# Patient Record
Sex: Female | Born: 1960 | Race: White | Hispanic: No | Marital: Married | Smoking: Never smoker
Health system: Southern US, Community
[De-identification: ages and names within clinical notes are randomized; demographics above are authoritative.]

## PROBLEM LIST (undated history)

## (undated) DIAGNOSIS — R519 Headache, unspecified: Secondary | ICD-10-CM

## (undated) DIAGNOSIS — Z803 Family history of malignant neoplasm of breast: Secondary | ICD-10-CM

## (undated) DIAGNOSIS — N3090 Cystitis, unspecified without hematuria: Secondary | ICD-10-CM

## (undated) DIAGNOSIS — Z9189 Other specified personal risk factors, not elsewhere classified: Secondary | ICD-10-CM

## (undated) DIAGNOSIS — Z8489 Family history of other specified conditions: Secondary | ICD-10-CM

## (undated) DIAGNOSIS — M1990 Unspecified osteoarthritis, unspecified site: Secondary | ICD-10-CM

## (undated) DIAGNOSIS — K219 Gastro-esophageal reflux disease without esophagitis: Secondary | ICD-10-CM

## (undated) DIAGNOSIS — Z1501 Genetic susceptibility to malignant neoplasm of breast: Secondary | ICD-10-CM

## (undated) DIAGNOSIS — N390 Urinary tract infection, site not specified: Secondary | ICD-10-CM

## (undated) DIAGNOSIS — Z1589 Genetic susceptibility to other disease: Secondary | ICD-10-CM

## (undated) DIAGNOSIS — R7303 Prediabetes: Secondary | ICD-10-CM

## (undated) DIAGNOSIS — R51 Headache: Secondary | ICD-10-CM

## (undated) DIAGNOSIS — Z87442 Personal history of urinary calculi: Secondary | ICD-10-CM

## (undated) DIAGNOSIS — Z1371 Encounter for nonprocreative screening for genetic disease carrier status: Secondary | ICD-10-CM

## (undated) DIAGNOSIS — R87619 Unspecified abnormal cytological findings in specimens from cervix uteri: Secondary | ICD-10-CM

## (undated) HISTORY — DX: Genetic susceptibility to other disease: Z15.89

## (undated) HISTORY — DX: Genetic susceptibility to malignant neoplasm of breast: Z15.01

## (undated) HISTORY — DX: Cystitis, unspecified without hematuria: N30.90

## (undated) HISTORY — DX: Encounter for nonprocreative screening for genetic disease carrier status: Z13.71

## (undated) HISTORY — DX: Other specified personal risk factors, not elsewhere classified: Z91.89

## (undated) HISTORY — PX: APPENDECTOMY: SHX54

## (undated) HISTORY — PX: TONSILLECTOMY: SUR1361

## (undated) HISTORY — DX: Unspecified abnormal cytological findings in specimens from cervix uteri: R87.619

## (undated) HISTORY — DX: Family history of malignant neoplasm of breast: Z80.3

---

## 1992-04-14 HISTORY — PX: BREAST CYST ASPIRATION: SHX578

## 2001-07-06 HISTORY — PX: BREAST BIOPSY: SHX20

## 2004-06-26 ENCOUNTER — Inpatient Hospital Stay: Payer: Self-pay | Admitting: Surgery

## 2004-06-26 ENCOUNTER — Ambulatory Visit: Payer: Self-pay | Admitting: Internal Medicine

## 2005-10-02 ENCOUNTER — Ambulatory Visit: Payer: Self-pay | Admitting: Unknown Physician Specialty

## 2005-11-14 ENCOUNTER — Ambulatory Visit: Payer: Self-pay | Admitting: Internal Medicine

## 2006-07-24 ENCOUNTER — Ambulatory Visit: Payer: Self-pay | Admitting: Family Medicine

## 2006-09-22 ENCOUNTER — Emergency Department: Payer: Self-pay | Admitting: Emergency Medicine

## 2006-09-23 ENCOUNTER — Emergency Department: Payer: Self-pay | Admitting: Emergency Medicine

## 2007-11-25 ENCOUNTER — Ambulatory Visit: Payer: Self-pay | Admitting: Unknown Physician Specialty

## 2008-11-28 ENCOUNTER — Ambulatory Visit: Payer: Self-pay | Admitting: General Surgery

## 2009-09-26 ENCOUNTER — Ambulatory Visit: Payer: Self-pay | Admitting: General Practice

## 2010-02-01 ENCOUNTER — Ambulatory Visit: Payer: Self-pay | Admitting: Unknown Physician Specialty

## 2011-03-12 ENCOUNTER — Ambulatory Visit: Payer: Self-pay | Admitting: Unknown Physician Specialty

## 2011-04-15 DIAGNOSIS — Z9189 Other specified personal risk factors, not elsewhere classified: Secondary | ICD-10-CM

## 2011-04-15 HISTORY — DX: Other specified personal risk factors, not elsewhere classified: Z91.89

## 2011-12-10 ENCOUNTER — Ambulatory Visit: Payer: Self-pay | Admitting: Unknown Physician Specialty

## 2012-02-04 DIAGNOSIS — Z1371 Encounter for nonprocreative screening for genetic disease carrier status: Secondary | ICD-10-CM

## 2012-02-04 HISTORY — DX: Encounter for nonprocreative screening for genetic disease carrier status: Z13.71

## 2014-11-05 DIAGNOSIS — T378X5A Adverse effect of other specified systemic anti-infectives and antiparasitics, initial encounter: Secondary | ICD-10-CM | POA: Insufficient documentation

## 2014-11-05 DIAGNOSIS — L500 Allergic urticaria: Secondary | ICD-10-CM | POA: Diagnosis not present

## 2014-11-05 MED ORDER — SODIUM CHLORIDE 0.9 % IV BOLUS (SEPSIS)
1000.0000 mL | Freq: Once | INTRAVENOUS | Status: AC
  Administered 2014-11-05: 1000 mL via INTRAVENOUS

## 2014-11-05 MED ORDER — METHYLPREDNISOLONE SODIUM SUCC 125 MG IJ SOLR
INTRAMUSCULAR | Status: AC
  Administered 2014-11-05: 125 mg via INTRAVENOUS
  Filled 2014-11-05: qty 2

## 2014-11-05 MED ORDER — FAMOTIDINE IN NACL 20-0.9 MG/50ML-% IV SOLN
INTRAVENOUS | Status: AC
  Administered 2014-11-05: 20 mg via INTRAVENOUS
  Filled 2014-11-05: qty 50

## 2014-11-05 MED ORDER — FAMOTIDINE IN NACL 20-0.9 MG/50ML-% IV SOLN
20.0000 mg | Freq: Once | INTRAVENOUS | Status: AC
  Administered 2014-11-05: 20 mg via INTRAVENOUS

## 2014-11-05 MED ORDER — METHYLPREDNISOLONE SODIUM SUCC 125 MG IJ SOLR
125.0000 mg | Freq: Once | INTRAMUSCULAR | Status: AC
  Administered 2014-11-05: 125 mg via INTRAVENOUS

## 2014-11-05 NOTE — ED Notes (Signed)
Pt ambulatory to triage with no difficulty. Pt reports she started taking macrobid on 10/27/14. Pt reports the first dose caused her feet to itch and she took benadryl and the itching went away. Friday night pt reports she started again and broke out with a rash and itching. Pt was seen by EMS and given bendaryl 50 mg IM.

## 2014-11-06 ENCOUNTER — Emergency Department
Admission: EM | Admit: 2014-11-06 | Discharge: 2014-11-06 | Disposition: A | Discharge status: Home / Self Care | Payer: PRIVATE HEALTH INSURANCE | Attending: Emergency Medicine | Admitting: Emergency Medicine

## 2014-11-06 DIAGNOSIS — T7840XA Allergy, unspecified, initial encounter: Secondary | ICD-10-CM

## 2014-11-06 MED ORDER — PREDNISONE 20 MG PO TABS: 40.0000 mg | ORAL_TABLET | Freq: Every day | ORAL | Status: DC

## 2014-11-06 NOTE — ED Notes (Signed)
Patient here with allergic reaction to macrobid.

## 2014-11-06 NOTE — Discharge Instructions (Signed)
Allergies  Allergies may happen from anything your body is sensitive to. This may be food, medicines, pollens, chemicals, and many other things. Food allergies can be severe and deadly.  HOME CARE  If you do not know what causes a reaction, keep a diary. Write down the foods you ate and the symptoms that followed. Avoid foods that cause reactions.  If you have red raised spots (hives) or a rash:  Take medicine as told by your doctor.  Use medicines for red raised spots and itching as needed.  Apply cold cloths (compresses) to the skin. Take a cool bath. Avoid hot baths or showers.  If you are severely allergic:  It is often necessary to go to the hospital after you have treated your reaction.  Wear your medical alert jewelry.  You and your family must learn how to give a allergy shot or use an allergy kit (anaphylaxis kit).  Always carry your allergy kit or shot with you. Use this medicine as told by your doctor if a severe reaction is occurring. GET HELP RIGHT AWAY IF:  You have trouble breathing or are making high-pitched whistling sounds (wheezing).  You have a tight feeling in your chest or throat.  You have a puffy (swollen) mouth.  You have red raised spots, puffiness (swelling), or itching all over your body.  You have had a severe reaction that was helped by your allergy kit or shot. The reaction can return once the medicine has worn off.  You think you are having a food allergy. Symptoms most often happen within 30 minutes of eating a food.  Your symptoms have not gone away within 2 days or are getting worse.  You have new symptoms.  You want to retest yourself with a food or drink you think causes an allergic reaction. Only do this under the care of a doctor. MAKE SURE YOU:   Understand these instructions.  Will watch your condition.  Will get help right away if you are not doing well or get worse. Document Released: 07/26/2012 Document Reviewed:  07/26/2012 [facility] Patient Information 2015 [place]. This information is not intended to replace advice given to you by your health care provider. Make sure you discuss any questions you have with your health care provider.

## 2014-11-06 NOTE — ED Provider Notes (Signed)
[facility] Emergency Department Provider Note  Time seen: 1:28 AM  I have reviewed the triage vital signs and the nursing notes.   HISTORY  Chief Complaint Allergic Reaction    HPI Michelle Mejia is a 54 y.o. female presents to the emergency department with hives. According to the patient she started taking Macrobid for a urinary tract infection on 10/27/14. She states she developed itching and hives, she took Benadryl and it went away. She states she has intermittently been getting hives, but she has continued to take the antibiotics. Tonight she had diffuse hives over her chest and upper extremities so she came to the emergency department. Denies any trouble breathing, denies any swelling. Describes the rash as moderate.     No past medical history on file.  There are no active problems to display for this patient.   No past surgical history on file.  No current outpatient prescriptions on file.  Allergies Doxycycline; Phenergan; Tramadol; Keflex; Ketek; Levaquin; Other; and Parafon forte dsc  No family history on file.  Social History History  Substance Use Topics  . Smoking status: Not on file  . Smokeless tobacco: Not on file  . Alcohol Use: Not on file    Review of Systems Constitutional: Negative for fever. Cardiovascular: Negative for chest pain. Respiratory: Negative for shortness of breath. Gastrointestinal: Negative for abdominal pain Genitourinary: States her urinary tract symptoms have resolved. Skin: Positive for hives 10-point ROS otherwise negative.  ____________________________________________   PHYSICAL EXAM:  VITAL SIGNS: ED Triage Vitals  Enc Vitals Group     BP 11/05/14 2219 129/77 mmHg     Pulse Rate 11/05/14 2219 96     Resp 11/05/14 2219 18     Temp 11/05/14 2219 98.3 F (36.8 C)     Temp Source 11/05/14 2219 Oral     SpO2 11/05/14 2219 96 %     Weight 11/05/14 2219 170 lb (77.111 kg)     Height 11/05/14  2219 5\' 4"  (1.626 m)     Head Cir --      Peak Flow --      Pain Score 11/05/14 2220 0     Pain Loc --      Pain Edu? --      Excl. in [place]? --     Constitutional: Alert and oriented. Well appearing and in no distress. Eyes: Normal exam ENT   Mouth/Throat: Mucous membranes are moist. No oral swelling noted. Cardiovascular: Normal rate, regular rhythm. No murmur Respiratory: Normal respiratory effort without tachypnea nor retractions. Breath sounds are clear Gastrointestinal: Soft and nontender. No distention.  Musculoskeletal: Nontender with normal range of motion in all extremities. Neurologic:  Normal speech and language. No gross focal neurologic deficits Skin:  Skin is warm, dry and intact. No hives currently. Psychiatric: Mood and affect are normal. Speech and behavior are normal.   ____________________________________________     INITIAL IMPRESSION / ASSESSMENT AND PLAN / ED COURSE  Pertinent labs & imaging results that were available during my care of the patient were reviewed by me and considered in my medical decision making (see chart for details).  Patient with likely allergic reaction to Macrobid. Patient received Solu-Medrol, Benadryl, Pepcid in the emergency department. Upon my examination the patient no longer has hives, the husband showed me pictures, and the hives appeared to be fairly moderate and diffuse. Patient denies any trouble breathing, or oral swelling at any time. We will discharge the patient home on daily  Claritin, Benadryl as needed, and 5 days of prednisone. I discussed this plan. The patient and the husband and they are agreeable. I also discussed discontinuing Macrobid. They're agreeable.  ____________________________________________   FINAL CLINICAL IMPRESSION(S) / ED DIAGNOSES  Allergic reaction   Harvest Dark, MD 11/06/14 [phone]

## 2015-01-13 DIAGNOSIS — M2550 Pain in unspecified joint: Secondary | ICD-10-CM | POA: Insufficient documentation

## 2015-02-19 ENCOUNTER — Encounter: Payer: Self-pay | Admitting: Physician Assistant

## 2015-02-19 ENCOUNTER — Ambulatory Visit: Payer: Self-pay | Admitting: Physician Assistant

## 2015-02-19 VITALS — BP 110/79 | HR 90 | Temp 97.9°F

## 2015-02-19 DIAGNOSIS — N390 Urinary tract infection, site not specified: Secondary | ICD-10-CM

## 2015-02-19 LAB — POCT URINALYSIS DIPSTICK
Bilirubin, UA: NEGATIVE
Glucose, UA: NEGATIVE
Ketones, UA: NEGATIVE
LEUKOCYTES UA: NEGATIVE
Nitrite, UA: NEGATIVE
PH UA: 5.5
PROTEIN UA: NEGATIVE
Spec Grav, UA: 1.02
Urobilinogen, UA: 0.2

## 2015-02-19 MED ORDER — PHENAZOPYRIDINE HCL 200 MG PO TABS: 200.0000 mg | ORAL_TABLET | Freq: Three times a day (TID) | ORAL | Status: DC | PRN

## 2015-02-19 MED ORDER — CIPROFLOXACIN HCL 500 MG PO TABS: 500.0000 mg | ORAL_TABLET | Freq: Two times a day (BID) | ORAL | Status: DC

## 2015-02-19 NOTE — Addendum Note (Signed)
Addended by: Rudene Anda T on: 02/19/2015 09:54 AM   Modules accepted: Orders

## 2015-02-19 NOTE — Progress Notes (Signed)
S/ 3 day hx of dysuria , frequency , and left flank pain, no fever ; was camping and had to go to [facility] bathroom , she held her urine more, denies vaginal d/c or gyn sxs Has been forcing po fluids  O/ VSS NAD Abd SP discomfort , neg CVA  U/A trace blood  A/ UTI  P cipro 500 mg one bid x 3 days. Rx pyridium 200 mg one tid prn #10 o rf.f/u prn not improving

## 2015-03-07 ENCOUNTER — Ambulatory Visit: Payer: Self-pay | Admitting: Physician Assistant

## 2015-03-07 ENCOUNTER — Encounter: Payer: Self-pay | Admitting: Physician Assistant

## 2015-03-07 VITALS — BP 121/70 | HR 101 | Temp 98.6°F

## 2015-03-07 DIAGNOSIS — J069 Acute upper respiratory infection, unspecified: Secondary | ICD-10-CM

## 2015-03-07 MED ORDER — FLUTICASONE PROPIONATE 50 MCG/ACT NA SUSP: 2.0000 | Freq: Every day | NASAL | Status: DC

## 2015-03-07 MED ORDER — BENZONATATE 200 MG PO CAPS: 200.0000 mg | ORAL_CAPSULE | Freq: Three times a day (TID) | ORAL | Status: DC | PRN

## 2015-03-07 NOTE — Progress Notes (Signed)
S: C/o runny nose and congestion for 3 days, no fever, chills, cp/sob, v/d; mucus clear throughout the day, cough is sporadic, voice is hoarse, felt worse last night than today  Using otc meds  O: PE: perrl eomi, normocephalic, tms dull, nasal mucosa red and swollen, throat injected, neck supple no lymph, lungs c t a, cv rrr, neuro intact  A:  Acute viral uri   P: drink fluids, continue regular meds , use otc meds of choice, return if not improving in 5 days, return earlier if worsening , tessalon perls for cough, flonase refill; rx for amoxil and diflucan given in case pt worsens over holiday weekend

## 2015-03-12 ENCOUNTER — Ambulatory Visit: Payer: Self-pay | Admitting: Physician Assistant

## 2015-03-12 ENCOUNTER — Encounter: Payer: Self-pay | Admitting: Physician Assistant

## 2015-03-12 VITALS — BP 130/70 | HR 110 | Temp 98.4°F

## 2015-03-12 DIAGNOSIS — J069 Acute upper respiratory infection, unspecified: Secondary | ICD-10-CM

## 2015-03-12 NOTE — Progress Notes (Signed)
S: recheck of cough and congestion, started on amoxil yesterday, started with low grade fever last 2 days and mucus turned green yesterday, cough was making her feel sob yesterday but is better today, no cp/sob today  O: vitals wnl, nad, tms dull, nasal mucosa grossly red and swollen, throat wnl, neck supple no lymph, lungs c t a, cv rrr, cough is dry  A: acute uri  P: finish amoxil, no work until has not had fever in 24 hours, return if worsening

## 2015-04-02 ENCOUNTER — Encounter: Payer: Self-pay | Admitting: *Deleted

## 2015-04-02 ENCOUNTER — Encounter
Admission: RE | Disposition: A | Discharge status: Home Health | Payer: Self-pay | Source: Ambulatory Visit | Attending: Unknown Physician Specialty

## 2015-04-02 ENCOUNTER — Ambulatory Visit: Payer: PRIVATE HEALTH INSURANCE | Admitting: Anesthesiology

## 2015-04-02 ENCOUNTER — Ambulatory Visit
Admission: RE | Admit: 2015-04-02 | Discharge: 2015-04-02 | Disposition: A | Discharge status: Home Health | Payer: PRIVATE HEALTH INSURANCE | Source: Ambulatory Visit | Attending: Unknown Physician Specialty | Admitting: Unknown Physician Specialty

## 2015-04-02 DIAGNOSIS — Z888 Allergy status to other drugs, medicaments and biological substances status: Secondary | ICD-10-CM | POA: Diagnosis not present

## 2015-04-02 DIAGNOSIS — D120 Benign neoplasm of cecum: Secondary | ICD-10-CM | POA: Diagnosis not present

## 2015-04-02 DIAGNOSIS — K621 Rectal polyp: Secondary | ICD-10-CM | POA: Diagnosis not present

## 2015-04-02 DIAGNOSIS — Z881 Allergy status to other antibiotic agents status: Secondary | ICD-10-CM | POA: Diagnosis not present

## 2015-04-02 DIAGNOSIS — Z1211 Encounter for screening for malignant neoplasm of colon: Secondary | ICD-10-CM | POA: Diagnosis present

## 2015-04-02 DIAGNOSIS — Z8371 Family history of colonic polyps: Secondary | ICD-10-CM | POA: Insufficient documentation

## 2015-04-02 DIAGNOSIS — K640 First degree hemorrhoids: Secondary | ICD-10-CM | POA: Diagnosis not present

## 2015-04-02 DIAGNOSIS — D124 Benign neoplasm of descending colon: Secondary | ICD-10-CM | POA: Insufficient documentation

## 2015-04-02 DIAGNOSIS — Z79899 Other long term (current) drug therapy: Secondary | ICD-10-CM | POA: Diagnosis not present

## 2015-04-02 HISTORY — PX: COLONOSCOPY: SHX5424

## 2015-04-02 SURGERY — COLONOSCOPY
Anesthesia: General

## 2015-04-02 MED ORDER — SODIUM CHLORIDE 0.9 % IV SOLN
INTRAVENOUS | Status: DC
  Administered 2015-04-02: 17:00:00 via INTRAVENOUS
  Administered 2015-04-02: 1000 mL via INTRAVENOUS

## 2015-04-02 MED ORDER — PROPOFOL 500 MG/50ML IV EMUL
INTRAVENOUS | Status: DC | PRN
  Administered 2015-04-02: 90 ug/kg/min via INTRAVENOUS

## 2015-04-02 MED ORDER — FENTANYL CITRATE (PF) 100 MCG/2ML IJ SOLN
INTRAMUSCULAR | Status: DC | PRN
  Administered 2015-04-02: 50 ug via INTRAVENOUS

## 2015-04-02 MED ORDER — SODIUM CHLORIDE 0.9 % IV SOLN: INTRAVENOUS | Status: DC

## 2015-04-02 MED ORDER — PROPOFOL 10 MG/ML IV BOLUS
INTRAVENOUS | Status: DC | PRN
  Administered 2015-04-02: 40 mg via INTRAVENOUS

## 2015-04-02 NOTE — Op Note (Signed)
[facility] Gastroenterology Patient Name: Michelle Mejia Procedure Date: 04/02/2015 1:52 PM MRN: [id number] Account #: [id number] Date of Birth: 1960/11/03 Admit Type: Outpatient Age: 54 Room: [facility] ENDO ROOM 1 Gender: Female Note Status: Finalized Procedure:         Colonoscopy Indications:       Screening for colorectal malignant neoplasm Providers:         Manya Silvas, MD Referring MD:      Leonie Douglas. Doy Hutching, MD (Referring MD), Versie Starks, MD                     (Referring MD) Medicines:         Propofol per Anesthesia Complications:     No immediate complications. Procedure:         Pre-Anesthesia Assessment:                    - After reviewing the risks and benefits, the patient was                     deemed in satisfactory condition to undergo the procedure.                    After obtaining informed consent, the colonoscope was                     passed under direct vision. Throughout the procedure, the                     patient's blood pressure, pulse, and oxygen saturations                     were monitored continuously. The Colonoscope was                     introduced through the anus and advanced to the the cecum,                     identified by appendiceal orifice and ileocecal valve. The                     Colonoscope was introduced through the anus and advanced                     to the the cecum, identified by appendiceal orifice and                     ileocecal valve. The colonoscopy was performed without                     difficulty. The patient tolerated the procedure well. The                     quality of the bowel preparation was excellent. Findings:      A 10 mm polyp was found in the proximal descending colon. The polyp was       sessile. The polyp was removed with a hot snare. Resection and retrieval       were complete.      Three sessile polyps were found in the rectum. The polyps were       diminutive in  size. These polyps were removed with a jumbo cold forceps.       Resection and retrieval were complete.  Internal hemorrhoids were found during endoscopy. The hemorrhoids were       small and Grade I (internal hemorrhoids that do not prolapse).      A diminutive polyp was found in the cecum. The polyp was sessile. The       polyp was removed with a jumbo cold forceps. Resection and retrieval       were complete.      The exam was otherwise without abnormality. Impression:        - One 10 mm polyp in the proximal descending colon.                     Resected and retrieved.                    - Three diminutive polyps in the rectum. Resected and                     retrieved.                    - Internal hemorrhoids.                    - The examination was otherwise normal. Recommendation:    - Await pathology results.                    Manya Silvas, MD 04/02/2015 5:16:57 PM This report has been signed electronically. Number of Addenda: 0 Note Initiated On: 04/02/2015 1:52 PM Scope Withdrawal Time: 0 hours 12 minutes 11 seconds  Total Procedure Duration: 0 hours 17 minutes 39 seconds       [facility]

## 2015-04-02 NOTE — Anesthesia Preprocedure Evaluation (Signed)
Anesthesia Evaluation  Patient identified by MRN, date of birth, ID band Patient awake    Reviewed: Allergy & Precautions, H&P , NPO status , Patient's Chart, lab work & pertinent test results  History of Anesthesia Complications Negative for: history of anesthetic complications  Airway Mallampati: III  TM Distance: >3 FB Neck ROM: full    Dental no notable dental hx. (+) Teeth Intact   Pulmonary neg pulmonary ROS, neg shortness of breath,    Pulmonary exam normal breath sounds clear to auscultation       Cardiovascular (-) angina(-) Past MI and (-) DOE negative cardio ROS Normal cardiovascular exam Rhythm:regular Rate:Normal     Neuro/Psych negative neurological ROS  negative psych ROS   GI/Hepatic negative GI ROS, Neg liver ROS, neg GERD  ,  Endo/Other  negative endocrine ROS  Renal/GU negative Renal ROS  negative genitourinary   Musculoskeletal   Abdominal   Peds  Hematology negative hematology ROS (+)   Anesthesia Other Findings History reviewed. No pertinent past medical history.  Past Surgical History:   APPENDECTOMY                                                 BMI    Body Mass Index   29.16 kg/m 2      Reproductive/Obstetrics negative OB ROS                             Anesthesia Physical Anesthesia Plan  ASA: I  Anesthesia Plan: General   Post-op Pain Management:    Induction:   Airway Management Planned:   Additional Equipment:   Intra-op Plan:   Post-operative Plan:   Informed Consent: I have reviewed the patients History and Physical, chart, labs and discussed the procedure including the risks, benefits and alternatives for the proposed anesthesia with the patient or authorized representative who has indicated his/her understanding and acceptance.   Dental Advisory Given  Plan Discussed with: Anesthesiologist, CRNA and Surgeon  Anesthesia Plan  Comments:         Anesthesia Quick Evaluation

## 2015-04-02 NOTE — Anesthesia Postprocedure Evaluation (Signed)
Anesthesia Post Note  Patient: Michelle Mejia  Procedure(s) Performed: Procedure(s) (LRB): COLONOSCOPY (N/A)  Patient location during evaluation: PACU Anesthesia Type: General Level of consciousness: awake and alert Pain management: satisfactory to patient Vital Signs Assessment: post-procedure vital signs reviewed and stable Respiratory status: nonlabored ventilation Cardiovascular status: stable Anesthetic complications: no    Last Vitals:  Filed Vitals:   04/02/15 1551 04/02/15 1719  BP: 126/77 115/76  Pulse: 89 80  Temp: 36.4 C 36 C  Resp: 18 14    Last Pain: There were no vitals filed for this visit.               VAN STAVEREN,Michail Boyte

## 2015-04-02 NOTE — H&P (Signed)
Primary Care Physician:  Ashok Cordia, PA-C Primary Gastroenterologist:  Dr. Vira Agar  Pre-Procedure History & Physical: HPI:  Michelle Mejia is a 54 y.o. female is here for an colonoscopy.   History reviewed. No pertinent past medical history.  Past Surgical History  Procedure Laterality Date  . Appendectomy      Prior to Admission medications   Medication Sig Start Date End Date Taking? Authorizing Provider  amoxicillin (AMOXIL) 875 MG tablet Take 875 mg by mouth 2 (two) times daily. Reported on 04/02/2015 03/11/15   Historical Provider, MD  benzonatate (TESSALON) 200 MG capsule Take 1 capsule (200 mg total) by mouth 3 (three) times daily as needed for cough. Patient not taking: Reported on 04/02/2015 03/07/15   Versie Starks, PA-C  ciprofloxacin (CIPRO) 500 MG tablet Take 1 tablet (500 mg total) by mouth 2 (two) times daily. Patient not taking: Reported on 03/07/2015 02/19/15   Tommie Homero Fellers, FNP  DHA-EPA-VITAMIN E PO Take by mouth. Reported on 04/02/2015    Historical Provider, MD  fluticasone (FLONASE) 50 MCG/ACT nasal spray Place 2 sprays into both nostrils daily. 03/07/15   Versie Starks, PA-C  phenazopyridine (PYRIDIUM) 200 MG tablet Take 1 tablet (200 mg total) by mouth 3 (three) times daily as needed for pain. Patient not taking: Reported on 03/07/2015 02/19/15   Blima Singer, FNP  predniSONE (DELTASONE) 20 MG tablet Take 2 tablets (40 mg total) by mouth daily. Patient not taking: Reported on 03/07/2015 11/06/14   Harvest Dark, MD    Allergies as of 02/02/2015 - Review Complete 11/05/2014  Allergen Reaction Noted  . Doxycycline Nausea And Vomiting and Other (See Comments) 11/05/2014  . Phenergan [promethazine hcl] Nausea And Vomiting 11/05/2014  . Tramadol Nausea And Vomiting and Other (See Comments) 11/05/2014  . Keflex [cephalexin] Rash 11/05/2014  . Ketek [telithromycin] Itching and Rash 11/05/2014  . Levaquin [levofloxacin in d5w] Rash 11/05/2014  .  Other Rash 11/05/2014  . Parafon forte dsc [chlorzoxazone] Rash 11/05/2014    History reviewed. No pertinent family history.  Social History   Social History  . Marital Status: Married    Spouse Name: N/A  . Number of Children: N/A  . Years of Education: N/A   Occupational History  . Not on file.   Social History Main Topics  . Smoking status: Never Smoker   . Smokeless tobacco: Not on file  . Alcohol Use: Not on file  . Drug Use: Not on file  . Sexual Activity: Not on file   Other Topics Concern  . Not on file   Social History Narrative    Review of Systems: See HPI, otherwise negative ROS  Physical Exam: BP 126/77 mmHg  Pulse 89  Temp(Src) 97.5 F (36.4 C) (Tympanic)  Resp 18  Ht 5\' 4"  (1.626 m)  Wt 77.111 kg (170 lb)  BMI 29.17 kg/m2  SpO2 100%  LMP  General:   Alert,  pleasant and cooperative in NAD Head:  Normocephalic and atraumatic. Neck:  Supple; no masses or thyromegaly. Lungs:  Clear throughout to auscultation.    Heart:  Regular rate and rhythm. Abdomen:  Soft, nontender and nondistended. Normal bowel sounds, without guarding, and without rebound.   Neurologic:  Alert and  oriented x4;  grossly normal neurologically.  Impression/Plan: MARLII FROST is here for an colonoscopy to be performed for screening  Risks, benefits, limitations, and alternatives regarding  colonoscopy have been reviewed with the patient.  Questions have been answered.  All parties agreeable.   Gaylyn Cheers, MD  04/02/2015, 4:47 PM

## 2015-04-02 NOTE — Transfer of Care (Signed)
Immediate Anesthesia Transfer of Care Note  Patient: Michelle Mejia  Procedure(s) Performed: Procedure(s): COLONOSCOPY (N/A)  Patient Location: PACU  Anesthesia Type:General  Level of Consciousness: awake, alert  and oriented  Airway & Oxygen Therapy: Patient Spontanous Breathing and Patient connected to nasal cannula oxygen  Post-op Assessment: Report given to RN and Post -op Vital signs reviewed and stable  Post vital signs: Reviewed and stable  Last Vitals:  Filed Vitals:   04/02/15 1551 04/02/15 1719  BP: 126/77 115/76  Pulse: 89 80  Temp: 36.4 C 36 C  Resp: 18 14    Complications: No apparent anesthesia complications

## 2015-04-04 LAB — SURGICAL PATHOLOGY

## 2015-04-05 ENCOUNTER — Encounter: Payer: Self-pay | Admitting: Unknown Physician Specialty

## 2015-08-16 ENCOUNTER — Encounter: Payer: Self-pay | Admitting: Physician Assistant

## 2015-08-16 ENCOUNTER — Ambulatory Visit: Payer: Self-pay | Admitting: Physician Assistant

## 2015-08-16 VITALS — BP 120/70 | HR 82 | Temp 97.9°F

## 2015-08-16 DIAGNOSIS — N390 Urinary tract infection, site not specified: Secondary | ICD-10-CM

## 2015-08-16 LAB — POCT URINALYSIS DIPSTICK
Bilirubin, UA: NEGATIVE
Blood, UA: NEGATIVE
Glucose, UA: NEGATIVE
Ketones, UA: NEGATIVE
LEUKOCYTES UA: NEGATIVE
NITRITE UA: POSITIVE
PH UA: 5
PROTEIN UA: NEGATIVE
Spec Grav, UA: >=1.03
UROBILINOGEN UA: 0.2

## 2015-08-16 MED ORDER — CIPROFLOXACIN HCL 250 MG PO TABS: 250.0000 mg | ORAL_TABLET | Freq: Two times a day (BID) | ORAL | Status: DC

## 2015-08-16 NOTE — Progress Notes (Signed)
S:  C/o uti sx for 2 days, burning, urgency, frequency, denies vaginal discharge, abdominal pain or flank pain:  Remainder ros neg  O:  Vitals wnl, nad, no cva tenderness, back nontender, lungs c t a,cv rrr, abd soft nontender, bs normal, n/v intact  A: uti  P: cipro 250mg  bid x 7d, increase water intake, add cranberry juice, return if not improving in 2 -3 days, return earlier if worsening, discussed pyelonephritis sx

## 2016-04-15 ENCOUNTER — Encounter: Payer: Self-pay | Admitting: Physician Assistant

## 2016-04-15 ENCOUNTER — Ambulatory Visit: Payer: Self-pay | Admitting: Physician Assistant

## 2016-04-15 VITALS — BP 130/70 | HR 107 | Temp 98.3°F

## 2016-04-15 DIAGNOSIS — N3000 Acute cystitis without hematuria: Secondary | ICD-10-CM

## 2016-04-15 LAB — POCT URINALYSIS DIPSTICK
Bilirubin, UA: NEGATIVE
Glucose, UA: NEGATIVE
Ketones, UA: NEGATIVE
LEUKOCYTES UA: NEGATIVE
NITRITE UA: NEGATIVE
PH UA: 5.5
PROTEIN UA: NEGATIVE
Spec Grav, UA: 1.025
UROBILINOGEN UA: 0.2

## 2016-04-15 MED ORDER — FLUTICASONE PROPIONATE 50 MCG/ACT NA SUSP: 2.0000 | Freq: Every day | NASAL | 6 refills | Status: DC

## 2016-04-15 NOTE — Progress Notes (Signed)
S: C/o runny nose and congestion for 3 -4 weeks, no fever, chills, cp/sob, v/d; mucus is green and thick, cough is sporadic, c/o of facial and dental pain. Also recent uti and was given septra, ?if its gone  Using otc meds:   O: PE: vitals wnl nad, perrl eomi, normocephalic, tms dull, nasal mucosa pink and swollen, throat injected, neck supple no lymph, lungs c t a, cv rrr, neuro intact  A:  Acute rhinitis, pnd, resolved uti   P: drink fluids, continue regular meds , use otc meds of choice, return if not improving in 5 days, return earlier if worsening , flonase, urine culture due to freq utis;

## 2016-04-16 LAB — URINE CULTURE

## 2016-07-18 ENCOUNTER — Encounter: Payer: Self-pay | Admitting: Physician Assistant

## 2016-07-18 ENCOUNTER — Ambulatory Visit: Payer: Self-pay | Admitting: Physician Assistant

## 2016-07-18 VITALS — BP 109/79 | HR 89 | Temp 98.8°F

## 2016-07-18 DIAGNOSIS — N390 Urinary tract infection, site not specified: Secondary | ICD-10-CM

## 2016-07-18 LAB — POCT URINALYSIS DIPSTICK
Blood, UA: NEGATIVE
Leukocytes, UA: NEGATIVE
Nitrite, UA: POSITIVE
SPEC GRAV UA: 1.025 (ref 1.030–1.035)
UROBILINOGEN UA: 1 (ref ?–2.0)
pH, UA: 5.5 (ref 5.0–8.0)

## 2016-07-18 MED ORDER — PHENAZOPYRIDINE HCL 200 MG PO TABS: 200.0000 mg | ORAL_TABLET | Freq: Three times a day (TID) | ORAL | 0 refills | Status: DC | PRN

## 2016-07-18 MED ORDER — SULFAMETHOXAZOLE-TRIMETHOPRIM 800-160 MG PO TABS: 1.0000 | ORAL_TABLET | Freq: Two times a day (BID) | ORAL | 0 refills | Status: DC

## 2016-07-18 NOTE — Progress Notes (Signed)
   Subjective:UTI    Patient ID: Michelle Mejia, female    DOB: 1960-09-01, 56 y.o.   MRN: [id number]  HPI Patient c/o 2 days of urinary frequency, urgency, and dysuria. Denies vaginal discharge, flank pain, or fever. Third episode this year.   Review of Systems    Negative except for compliant. Objective:   Physical Exam: deferred Dipstick UA revealed +Leuc and Nitrate.        Assessment & Plan:UTI  Urine culture pending. Start Bactrim DS and Pyridium as directed.

## 2016-07-21 LAB — URINE CULTURE

## 2016-08-11 ENCOUNTER — Ambulatory Visit (INDEPENDENT_AMBULATORY_CARE_PROVIDER_SITE_OTHER): Payer: Managed Care, Other (non HMO) | Admitting: Obstetrics & Gynecology

## 2016-08-11 ENCOUNTER — Encounter: Payer: Self-pay | Admitting: Obstetrics & Gynecology

## 2016-08-11 VITALS — BP 110/70 | Ht 64.0 in | Wt 183.0 lb

## 2016-08-11 DIAGNOSIS — Z1211 Encounter for screening for malignant neoplasm of colon: Secondary | ICD-10-CM | POA: Diagnosis not present

## 2016-08-11 DIAGNOSIS — R232 Flushing: Secondary | ICD-10-CM

## 2016-08-11 DIAGNOSIS — Z124 Encounter for screening for malignant neoplasm of cervix: Secondary | ICD-10-CM

## 2016-08-11 DIAGNOSIS — N393 Stress incontinence (female) (male): Secondary | ICD-10-CM

## 2016-08-11 DIAGNOSIS — Z0000 Encounter for general adult medical examination without abnormal findings: Secondary | ICD-10-CM | POA: Diagnosis not present

## 2016-08-11 DIAGNOSIS — Z1231 Encounter for screening mammogram for malignant neoplasm of breast: Secondary | ICD-10-CM | POA: Diagnosis not present

## 2016-08-11 DIAGNOSIS — Z1239 Encounter for other screening for malignant neoplasm of breast: Secondary | ICD-10-CM

## 2016-08-11 NOTE — Patient Instructions (Signed)
Black Cohosh, Cimicifuga racemosa oral dosage forms What is this medicine? BLACK COHOSH (blak KOH hosh) or Cimicifuga racemosa is a dietary supplement. It is promoted to relieve symptoms of menopause, such as hot flashes. The FDA has not approved this supplement for any medical use. This supplement may be used for other purposes; ask your health care provider or pharmacist if you have questions. This medicine may be used for other purposes; ask your health care provider or pharmacist if you have questions. What should I tell my health care provider before I take this medicine? They need to know if you have any of these conditions: -breast cancer -cervical, ovarian or uterine cancer -high blood pressure -infertility -liver disease -menstrual changes or irregular periods -unusual vaginal or uterine bleeding -an unusual or allergic reaction to black cohosh, soybeans, tartrazine dye (yellow dye number 5), other medicines, foods, dyes, or preservatives -pregnant or trying to get pregnant -breast-feeding How should I use this medicine? Take this herb by mouth with a glass of water. Follow the directions on the package labeling, or talk to your health care professional. Do not use for longer than 6 months without the advice of a health care professional. Do not use if you are pregnant or breast-feeding. Talk to your obstetrician-gynecologist or certified nurse-midwife. This herb is not for use in children under the age of 18 years. Overdosage: If you think you have taken too much of this medicine contact a poison control center or emergency room at once. NOTE: This medicine is only for you. Do not share this medicine with others. What if I miss a dose? If you miss a dose, take it as soon as you can. If it is almost time for your next dose, take only that dose. Do not take double or extra doses. What may interact with this medicine? -atorvastatin -cisplatin -fertility treatments This list may not  describe all possible interactions. Give your health care provider a list of all the medicines, herbs, non-prescription drugs, or dietary supplements you use. Also tell them if you smoke, drink alcohol, or use illegal drugs. Some items may interact with your medicine. What should I watch for while using this medicine? Since this herb is derived from a plant, allergic reactions are possible. Stop using this herb if you develop a rash. You may need to see your health care professional, or inform them that this occurred. Report any unusual side effects promptly. If you are taking this herb for menstrual or menopausal symptoms, visit your doctor or health care professional for regular checks on your progress. You should have a complete check-up every 6 months. You will need a regular breast and pelvic exam while on this therapy. Follow the advice of your doctor or health care professional. Women should inform their doctor if they wish to become pregnant or think they might be pregnant. If you have any reason to think you are pregnant, stop taking this herb at once and contact your doctor or health care professional. Herbal or dietary supplements are not regulated like medicines. Rigid quality control standards are not required for dietary supplements. The purity and strength of these products can vary. The safety and effect of this dietary supplement for a certain disease or illness is not well known. This product is not intended to diagnose, treat, cure or prevent any disease. The Food and Drug Administration suggests the following to help consumers protect themselves: -Always read product labels and follow directions. -Natural does not mean a product is   safe for humans to take. -Look for products that include USP after the ingredient name. This means that the manufacturer followed the standards of the U.S. Pharmacopoeia. -Supplements made or sold by a nationally known food or drug company are more likely to  be made under tight controls. You can write to the company for more information about how the product was made. What side effects may I notice from receiving this medicine? Side effects that you should report to your doctor or health care professional as soon as possible: -allergic reactions like skin rash, itching or hives, swelling of the face, lips, or tongue -breathing problems -dizziness -palpitations -signs and symptoms of liver injury like dark yellow or brown urine; general ill feeling or flu-like symptoms; light-colored stools; loss of appetite; nausea; right upper belly pain; unusually weak or tired; yellowing of the eyes or skin -unusual vaginal bleeding Side effects that usually do not require medical attention (report to your doctor or health care professional if they continue or are bothersome): -breast tenderness -headache -nausea -upset stomach This list may not describe all possible side effects. Call your doctor for medical advice about side effects. You may report side effects to FDA at 1-800-FDA-1088. Where should I keep my medicine? Keep out of the reach of children. Store at room temperature between 15 and 30 degrees C (59 and 86 degrees C). Throw away any unused herb after the expiration date. NOTE: This sheet is a summary. It may not cover all possible information. If you have questions about this medicine, talk to your doctor, pharmacist, or health care provider.  2018 Elsevier/Gold Standard (2015-10-10 14:35:09)  

## 2016-08-11 NOTE — Progress Notes (Signed)
HPI:      [name] is a 56 y.o. G1P1001 who LMP was in the past, she presents today for her annual examination.  The patient has no complaints today. The patient is sexually active. Herlast pap: was normal and (2017, no HPV done) and last mammogram: was normal.  The patient does perform self breast exams.  There is notable family history of breast or ovarian cancer in her family. The patient is not taking hormone replacement therapy. Patient denies post-menopausal vaginal bleeding.  LMP 11/2015, sporadic last 2 years.The patient has regular exercise: yes. The patient denies current symptoms of depression.   Occas leakage of urine w stress, wears liner. Hot flashes a bother  GYN Hx: Last Colonoscopy:3 years ago. Normal.  Last DEXA: never ago.    PMHx: No past medical history on file. Past Surgical History:  Procedure Laterality Date  . APPENDECTOMY    . COLONOSCOPY N/A 04/02/2015   Procedure: COLONOSCOPY;  Surgeon: Manya Silvas, MD;  Location: [facility] ENDOSCOPY;  Service: Endoscopy;  Laterality: N/A;   Family History  Problem Relation Age of Onset  . Breast cancer Mother   . Bone cancer Mother   . Ovarian cancer Paternal Aunt    Social History  Substance Use Topics  . Smoking status: Never Smoker  . Smokeless tobacco: Never Used  . Alcohol use No   No current outpatient prescriptions on file. Allergies: Doxycycline; Phenergan [promethazine hcl]; Tramadol; Keflex [cephalexin]; Ketek [telithromycin]; Levaquin [levofloxacin in d5w]; Nitrofurantoin monohyd macro; Other; and Parafon forte dsc [chlorzoxazone]  Review of Systems  Constitutional: Negative for chills, fever and malaise/fatigue.  HENT: Negative for congestion, sinus pain and sore throat.   Eyes: Negative for blurred vision and pain.  Respiratory: Negative for cough and wheezing.   Cardiovascular: Negative for chest pain and leg swelling.  Gastrointestinal: Negative for abdominal pain, constipation, diarrhea,  heartburn, nausea and vomiting.  Genitourinary: Negative for dysuria, frequency, hematuria and urgency.  Musculoskeletal: Negative for back pain, joint pain, myalgias and neck pain.  Skin: Negative for itching and rash.  Neurological: Negative for dizziness, tremors and weakness.  Endo/Heme/Allergies: Does not bruise/bleed easily.  Psychiatric/Behavioral: Negative for depression. The patient is not nervous/anxious and does not have insomnia.     Objective: BP 110/70   Ht 5\' 4"  (1.626 m)   Wt 183 lb (83 kg)   LMP 11/14/2015   BMI 31.41 kg/m   Filed Weights   08/11/16 0831  Weight: 183 lb (83 kg)   Body mass index is 31.41 kg/m. Physical Exam  Constitutional: She is oriented to person, place, and time. She appears well-developed and well-nourished. No distress.  Genitourinary: Rectum normal, vagina normal and uterus normal. Pelvic exam was performed with patient supine. There is no rash or lesion on the right labia. There is no rash or lesion on the left labia. Vagina exhibits no lesion. No bleeding in the vagina. Right adnexum does not display mass and does not display tenderness. Left adnexum does not display mass and does not display tenderness. Cervix does not exhibit motion tenderness, lesion, friability or polyp.   Uterus is mobile and midaxial. Uterus is not enlarged or exhibiting a mass.  HENT:  Head: Normocephalic and atraumatic. Head is without laceration.  Right Ear: Hearing normal.  Left Ear: Hearing normal.  Nose: No epistaxis.  No foreign bodies.  Mouth/Throat: Uvula is midline, oropharynx is clear and moist and mucous membranes are normal.  Eyes: Pupils are equal, round, and reactive  to light.  Neck: Normal range of motion. Neck supple. No thyromegaly present.  Cardiovascular: Normal rate and regular rhythm.  Exam reveals no gallop and no friction rub.   No murmur heard. Pulmonary/Chest: Effort normal and breath sounds normal. No respiratory distress. She has no  wheezes. Right breast exhibits no mass, no skin change and no tenderness. Left breast exhibits no mass, no skin change and no tenderness.  Abdominal: Soft. Bowel sounds are normal. She exhibits no distension. There is no tenderness. There is no rebound.  Musculoskeletal: Normal range of motion.  Neurological: She is alert and oriented to person, place, and time. No cranial nerve deficit.  Skin: Skin is warm and dry.  Psychiatric: She has a normal mood and affect. Judgment normal.  Vitals reviewed.   Assessment: Annual Exam 1. Annual physical exam   2. Screening for breast cancer   3. Screen for colon cancer   4. Screening for cervical cancer   5. Hot flashes   6. Stress incontinence     Plan:            1.  Cervical Screening-  Pap smear done today, Pap smear schedule reviewed with patient  2. Breast screening- Exam annually and mammogram scheduled  3. Colonoscopy every 10 years, Hemoccult testing after age 28  4. Labs managed by PCP  5. Counseling for hormonal therapy: none  6. Hot flashes.  Patient with bothersome menopausal vasomotor symptoms. Discussed lifestyle interventions such as wearing light clothing, remaining in cool environments, having fan/air conditioner in the room, avoiding hot beverages etc.  Exercise also shown to be significantly helpful in alleviating hot flashes.  Discussed using hormone therapy and concerns about increased risk of heart disease, cerebrovascular disease, thromboembolic disease,  and breast cancer.  Also discussed other medical options such as Clonidine, SSRI, or Neurontin.   Also discussed alternative therapies such as herbal remedies but cautioned that most of the products contained phytoestrogens (plant estrogens) in unregulated amounts which can have the same effects on the body as the pharmaceutical estrogen preparations.  Patient opted for [facility]  therapy for now. She will return in 2 months for re-evaluation.              7. Weight  gain, increase exercise, monitor    F/U  Return in about 1 year (around 08/11/2017) for Annual.  Barnett Applebaum, MD, [facility], [facility] 08/11/2016  9:20 AM

## 2016-08-13 LAB — IGP, APTIMA HPV
HPV Aptima: NEGATIVE
PAP SMEAR COMMENT: 0

## 2016-08-20 ENCOUNTER — Encounter (INDEPENDENT_AMBULATORY_CARE_PROVIDER_SITE_OTHER): Payer: Self-pay

## 2016-08-20 ENCOUNTER — Ambulatory Visit: Payer: Self-pay | Admitting: Physician Assistant

## 2016-08-20 VITALS — BP 121/79 | HR 77 | Temp 98.5°F | Ht 64.0 in | Wt 180.0 lb

## 2016-08-20 DIAGNOSIS — Z299 Encounter for prophylactic measures, unspecified: Secondary | ICD-10-CM

## 2016-08-20 DIAGNOSIS — Z008 Encounter for other general examination: Secondary | ICD-10-CM

## 2016-08-20 DIAGNOSIS — Z0189 Encounter for other specified special examinations: Secondary | ICD-10-CM

## 2016-08-20 NOTE — Progress Notes (Signed)
Patient came in to have her biometric screening and have her blood drawn for testing per Susan's authorization.

## 2016-08-21 LAB — CMP12+LP+TP+TSH+6AC+CBC/D/PLT
ALBUMIN: 4.7 g/dL (ref 3.5–5.5)
ALT: 24 IU/L (ref 0–32)
AST: 21 IU/L (ref 0–40)
Albumin/Globulin Ratio: 1.7 (ref 1.2–2.2)
Alkaline Phosphatase: 89 IU/L (ref 39–117)
BASOS ABS: 0.1 10*3/uL (ref 0.0–0.2)
BILIRUBIN TOTAL: 0.3 mg/dL (ref 0.0–1.2)
BUN / CREAT RATIO: 21 (ref 9–23)
BUN: 20 mg/dL (ref 6–24)
Basos: 1 %
CHLORIDE: 102 mmol/L (ref 96–106)
CREATININE: 0.95 mg/dL (ref 0.57–1.00)
Calcium: 9.5 mg/dL (ref 8.7–10.2)
Chol/HDL Ratio: 3.2 ratio (ref 0.0–4.4)
Cholesterol, Total: 211 mg/dL — ABNORMAL HIGH (ref 100–199)
EOS (ABSOLUTE): 0.1 10*3/uL (ref 0.0–0.4)
EOS: 1 %
Estimated CHD Risk: <0.5 times avg. (ref 0.0–1.0)
Free Thyroxine Index: 2 (ref 1.2–4.9)
GFR calc Af Amer: 79 mL/min/{1.73_m2} (ref >59)
GFR, EST NON AFRICAN AMERICAN: 69 mL/min/{1.73_m2} (ref >59)
GGT: 26 IU/L (ref 0–60)
Globulin, Total: 2.8 g/dL (ref 1.5–4.5)
Glucose: 98 mg/dL (ref 65–99)
HDL: 65 mg/dL (ref >39)
Hematocrit: 44.7 % (ref 34.0–46.6)
Hemoglobin: 15 g/dL (ref 11.1–15.9)
IMMATURE GRANS (ABS): 0 10*3/uL (ref 0.0–0.1)
IMMATURE GRANULOCYTES: 0 %
IRON: 65 ug/dL (ref 27–159)
LDH: 229 IU/L — AB (ref 119–226)
LDL Calculated: 135 mg/dL — ABNORMAL HIGH (ref 0–99)
LYMPHS: 38 %
Lymphocytes Absolute: 2.6 10*3/uL (ref 0.7–3.1)
MCH: 29.6 pg (ref 26.6–33.0)
MCHC: 33.6 g/dL (ref 31.5–35.7)
MCV: 88 fL (ref 79–97)
MONOCYTES: 5 %
Monocytes Absolute: 0.3 10*3/uL (ref 0.1–0.9)
NEUTROS PCT: 55 %
Neutrophils Absolute: 3.7 10*3/uL (ref 1.4–7.0)
PLATELETS: 323 10*3/uL (ref 150–379)
Phosphorus: 4.1 mg/dL (ref 2.5–4.5)
Potassium: 4.5 mmol/L (ref 3.5–5.2)
RBC: 5.06 x10E6/uL (ref 3.77–5.28)
RDW: 13.6 % (ref 12.3–15.4)
Sodium: 143 mmol/L (ref 134–144)
T3 UPTAKE RATIO: 26 % (ref 24–39)
T4, Total: 7.8 ug/dL (ref 4.5–12.0)
TOTAL PROTEIN: 7.5 g/dL (ref 6.0–8.5)
TSH: 2.09 u[IU]/mL (ref 0.450–4.500)
Triglycerides: 53 mg/dL (ref 0–149)
URIC ACID: 4.8 mg/dL (ref 2.5–7.1)
VLDL CHOLESTEROL CAL: 11 mg/dL (ref 5–40)
WBC: 6.8 10*3/uL (ref 3.4–10.8)

## 2016-08-21 LAB — VITAMIN D 25 HYDROXY (VIT D DEFICIENCY, FRACTURES): Vit D, 25-Hydroxy: 32.5 ng/mL (ref 30.0–100.0)

## 2016-10-03 ENCOUNTER — Ambulatory Visit: Payer: Self-pay | Admitting: Physician Assistant

## 2016-10-03 VITALS — BP 125/80 | HR 91 | Temp 98.5°F

## 2016-10-03 DIAGNOSIS — J0100 Acute maxillary sinusitis, unspecified: Secondary | ICD-10-CM

## 2016-10-03 LAB — FECAL OCCULT BLOOD, IMMUNOCHEMICAL: FECAL OCCULT BLD: NEGATIVE

## 2016-10-03 MED ORDER — AZITHROMYCIN 250 MG PO TABS: ORAL_TABLET | ORAL | 0 refills | Status: DC

## 2016-10-03 MED ORDER — FLUTICASONE PROPIONATE 50 MCG/ACT NA SUSP: 2.0000 | Freq: Every day | NASAL | 2 refills | Status: DC

## 2016-10-03 NOTE — Progress Notes (Signed)
S: Is here today  with complaint of sinus congestion and pressure for several days. Patient states that she has usually one sinus infection per year. She has been taking Flonase and Claritin without much improvement. She denies any fever or chills. She is positive for posterior drainage. O: EACs are clear bilaterally. TMs are dull bilaterally. Nose mucosa is boggy and swollen. There is tenderness to percussion bilateral maxillary sinuses. Posterior pharynx with moderate injection and drainage. Neck is supple without adenopathy. Lungs are clear bilaterally. Heart regular rate and rhythm without murmur. A:  Maxillary sinusitis P:  Patient was given a refill on Flonase and also prescribed Zithromax which is worked for her in the past. She will continue taking Claritin as needed for seasonal allergies.

## 2016-10-03 NOTE — Consult Note (Signed)
S: Patient is is here with complaint of sinus pain and pressure. Patient has been using Flonase and Claritin without any relief. She is not aware of any fever and denies chills. Patient states that normally once he is she gets a sinus infection. O: EACs are clear TMs are dull bilaterally. Nasal mucosa slightly swollen and boggy. Posterior drainage with moderate posterior drainage. Moderate tenderness bilateral maxillary sinuses to percussion. Neck is supple without adenopathy. Lungs are clear bilaterally. Heart regular rate and rhythm without murmur. A:  Maxillary sinusitis. P:  Patient was given a refill on her Flonase nasal spray and a prescription for Zithromax that she has taken in the past with good results.

## 2016-10-10 ENCOUNTER — Other Ambulatory Visit: Payer: Self-pay | Admitting: Obstetrics & Gynecology

## 2016-10-10 ENCOUNTER — Ambulatory Visit
Admission: RE | Admit: 2016-10-10 | Discharge: 2016-10-10 | Disposition: A | Discharge status: Home / Self Care | Payer: Managed Care, Other (non HMO) | Source: Ambulatory Visit | Attending: Obstetrics & Gynecology | Admitting: Obstetrics & Gynecology

## 2016-10-10 DIAGNOSIS — Z1231 Encounter for screening mammogram for malignant neoplasm of breast: Secondary | ICD-10-CM | POA: Diagnosis not present

## 2016-10-10 DIAGNOSIS — Z1239 Encounter for other screening for malignant neoplasm of breast: Secondary | ICD-10-CM

## 2016-10-13 ENCOUNTER — Other Ambulatory Visit: Payer: Self-pay | Admitting: Obstetrics & Gynecology

## 2016-10-13 DIAGNOSIS — R928 Other abnormal and inconclusive findings on diagnostic imaging of breast: Secondary | ICD-10-CM

## 2016-10-13 NOTE — Progress Notes (Signed)
Orders for Right Dx MMG and US done

## 2016-10-17 ENCOUNTER — Telehealth: Payer: Self-pay

## 2016-10-17 NOTE — Telephone Encounter (Signed)
I spoke to the patient to confirm whether [facility] or [place] (Dr. Doreene Adas referral indicates a preferred imaging location of [place]). Patient said she is seen at [facility]. Patient is aware I will follow-up with Samantha at [place] on Monday morning, as they close at 3pm on Fridays. Patient prefers to be scheduled first thing in the morning. Patient was given my ext.

## 2016-10-17 NOTE — Telephone Encounter (Signed)
Pt's mammogram rec further imaging needed.  Was told in the report that if she hasn't heard anything about it being sched to call her doctor.  She wants to know what the next step is.  [phone]

## 2016-10-20 NOTE — Telephone Encounter (Signed)
Per Aldona Bar @ [place], the patient is at the top of her list and she will give her a call today.

## 2016-10-29 ENCOUNTER — Other Ambulatory Visit: Payer: Self-pay | Admitting: Obstetrics & Gynecology

## 2016-10-29 ENCOUNTER — Inpatient Hospital Stay
Admission: RE | Admit: 2016-10-29 | Discharge: 2016-10-29 | Disposition: A | Discharge status: Home / Self Care | Payer: Self-pay | Source: Ambulatory Visit | Attending: *Deleted | Admitting: *Deleted

## 2016-10-29 ENCOUNTER — Ambulatory Visit
Admission: RE | Admit: 2016-10-29 | Discharge: 2016-10-29 | Disposition: A | Discharge status: Home / Self Care | Payer: Managed Care, Other (non HMO) | Source: Ambulatory Visit | Attending: Obstetrics & Gynecology | Admitting: Obstetrics & Gynecology

## 2016-10-29 ENCOUNTER — Encounter: Payer: Self-pay | Admitting: General Surgery

## 2016-10-29 ENCOUNTER — Other Ambulatory Visit: Payer: Self-pay | Admitting: *Deleted

## 2016-10-29 ENCOUNTER — Telehealth: Payer: Self-pay | Admitting: General Surgery

## 2016-10-29 DIAGNOSIS — Z9289 Personal history of other medical treatment: Secondary | ICD-10-CM

## 2016-10-29 DIAGNOSIS — R928 Other abnormal and inconclusive findings on diagnostic imaging of breast: Secondary | ICD-10-CM

## 2016-10-29 NOTE — Telephone Encounter (Signed)
LAST SEEN BY DR Bary Castilla 11-18-2010

## 2016-10-29 NOTE — Progress Notes (Signed)
Ref to Dr Bary Castilla (has seen in past) for abnormal right mammogram.  I have d/w pt and she is expecting call from [name] or their office representative.

## 2016-10-29 NOTE — Telephone Encounter (Signed)
LEFT MESSAGE FOR PATIENT TO CALL & SCHEDULE AN APPOINTMENT WITH DR BYRNETT,REF'D BY DR Teresa Coombs FOR CAT 4 MAMMO/U/S DONE 10-29-16

## 2016-11-06 ENCOUNTER — Encounter: Payer: Self-pay | Admitting: General Surgery

## 2016-11-06 ENCOUNTER — Other Ambulatory Visit: Payer: Self-pay | Admitting: General Surgery

## 2016-11-06 ENCOUNTER — Ambulatory Visit (INDEPENDENT_AMBULATORY_CARE_PROVIDER_SITE_OTHER): Payer: Managed Care, Other (non HMO) | Admitting: General Surgery

## 2016-11-06 VITALS — BP 120/80 | HR 78 | Resp 12 | Ht 64.0 in | Wt 181.0 lb

## 2016-11-06 DIAGNOSIS — R928 Other abnormal and inconclusive findings on diagnostic imaging of breast: Secondary | ICD-10-CM

## 2016-11-06 NOTE — Progress Notes (Signed)
Patient ID: Michelle Mejia, female   DOB: November 25, 1960, 56 y.o.   MRN: [id number]  Chief Complaint  Patient presents with  . Mass    HPI Michelle Mejia is a 56 y.o. female who presents for a breast evaluation. The most recent mammogram was done on  10/10/2016 added view on 10/29/2016. Patient does perform regular self breast checks and gets regular mammograms done.  Genetic testing completed at [place] was reported as negative by the patient.  Family history of breast cancer. Husband, Lennette Bihari is present at visit.  HPI  History reviewed. No pertinent past medical history.  Past Surgical History:  Procedure Laterality Date  . APPENDECTOMY    . BREAST BIOPSY Right +/- 5 YRS AGO   x2 - NEG  . BREAST BIOPSY Left 1994  . COLONOSCOPY N/A 04/02/2015   Procedure: COLONOSCOPY;  Surgeon: Manya Silvas, MD;  Location: [facility] ENDOSCOPY;  Service: Endoscopy;  Laterality: N/A;    Family History  Problem Relation Age of Onset  . Breast cancer Mother 28  . Bone cancer Mother   . Ovarian cancer Paternal Aunt   . Breast cancer Maternal Aunt     Social History Social History  Substance Use Topics  . Smoking status: Never Smoker  . Smokeless tobacco: Never Used  . Alcohol use No    Allergies  Allergen Reactions  . Doxycycline Nausea And Vomiting and Other (See Comments)    dizzy  . Phenergan [Promethazine Hcl] Nausea And Vomiting  . Tramadol Nausea And Vomiting and Other (See Comments)    dizzy  . Keflex [Cephalexin] Rash  . Ketek [Telithromycin] Itching and Rash  . Levaquin [Levofloxacin In D5w] Rash  . Nitrofurantoin Monohyd Macro Rash  . Other Rash    Muscle relaxer medication but not flexeril or robaxin pt unsure of the name.   Michelle Mejia Dsc [Chlorzoxazone] Rash    Current Outpatient Prescriptions  Medication Sig Dispense Refill  . fluticasone (FLONASE) 50 MCG/ACT nasal spray Place 2 sprays into both nostrils daily. 16 g 2  . meloxicam (MOBIC) 15 MG tablet      No  current facility-administered medications for this visit.     Review of Systems Review of Systems  Constitutional: Negative.   Respiratory: Negative.   Cardiovascular: Negative.     Blood pressure 120/80, pulse 78, resp. rate 12, height 5\' 4"  (1.626 m), weight 181 lb (82.1 kg), last menstrual period 02/07/2016.  Physical Exam Physical Exam  Constitutional: She is oriented to person, place, and time. She appears well-developed and well-nourished.  Eyes: Conjunctivae are normal. No scleral icterus.  Neck: Neck supple.  Cardiovascular: Normal rate, regular rhythm and normal heart sounds.   Pulmonary/Chest: Effort normal and breath sounds normal. Right breast exhibits no inverted nipple, no mass, no nipple discharge, no skin change and no tenderness. Left breast exhibits no inverted nipple, no mass, no nipple discharge, no skin change and no tenderness. Breasts are asymmetrical (left breast slightly ptotic compared to the right breast).  Abdominal: Soft. Bowel sounds are normal. There is no tenderness.  Lymphadenopathy:    She has no cervical adenopathy.    She has no axillary adenopathy.  Neurological: She is alert and oriented to person, place, and time.  Skin: Skin is warm and dry.    Data Reviewed Mammograms from 2016 through 2018 were reviewed as well as the recently completed) ultrasound.  Marked diminution in breast parenchyma volume between 2017 2018, correlating with the fact that she  has not had a menstrual period 11 months.  Architectural distortion in the area of previous biopsy clip is noted and 3-D biopsy recommended. BI-RADS-4.  Core biopsy from this area in the right breast completed March 2003 showed fibrocystic changes, a Parker metaplasia, ductal adenosis and ductal epithelial hyperplasia. This is in the same area of present concern for architectural distortion  Assessment    Abnormal mammogram likely related to menopause.    Plan    The patient reports the  radiologist told her that even with a negative biopsy surgical excision would be recommended. I suspect this is in the possibility of radial scar. We briefly touched upon the fact that radial scar does not require excision unless atypia is identified or the area encompasses a diameter over 1 cm.  The patient will be contacted [name] and as the pathology report is available and we'll make further treatment plans from that point.  It was emphasized to the patient that even with her family history of breast cancer in her mother and 2 aunts, with her negative genetic testing her risk remains that of the average middle-aged white woman and that this every decade issue with mammograms should prompt her to move to a more aggressive thought regarding mastectomy.  Based on her previous biopsies, age, menstrual status and childbearing status her present Baker Janus model risk is 2.9% over 5 years, 20.7% over a lifetime. She would certainly can be a candidate for chemoprevention if she is interested in pursuing this.  We'll discuss this after her next biopsy is available.    Patient to have a right breast Stereotactic core needle biopsy with 3D guidance .The patient is aware to call back for any questions or concerns.   HPI, Physical Exam, Assessment and Plan have been scribed under the direction and in the presence of Hervey Ard, MD.  Gaspar Cola, CMA  I have completed the exam and reviewed the above documentation for accuracy and completeness.  I agree with the above.  Haematologist has been used and any errors in dictation or transcription are unintentional.  Hervey Ard, M.D., F.A.C.S.  Robert Bellow 11/06/2016, 8:51 AM

## 2016-11-06 NOTE — Patient Instructions (Signed)
Patient to have a right breast Stereotactic core needle biopsy with 3D guidance .The patient is aware to call back for any questions or concerns.

## 2016-11-10 ENCOUNTER — Ambulatory Visit
Admission: RE | Admit: 2016-11-10 | Discharge: 2016-11-10 | Disposition: A | Discharge status: Home / Self Care | Payer: Managed Care, Other (non HMO) | Source: Ambulatory Visit | Attending: General Surgery | Admitting: General Surgery

## 2016-11-10 DIAGNOSIS — R928 Other abnormal and inconclusive findings on diagnostic imaging of breast: Secondary | ICD-10-CM | POA: Insufficient documentation

## 2016-11-10 DIAGNOSIS — N6021 Fibroadenosis of right breast: Secondary | ICD-10-CM | POA: Insufficient documentation

## 2016-11-10 DIAGNOSIS — N62 Hypertrophy of breast: Secondary | ICD-10-CM | POA: Insufficient documentation

## 2016-11-10 DIAGNOSIS — D241 Benign neoplasm of right breast: Secondary | ICD-10-CM | POA: Insufficient documentation

## 2016-11-10 DIAGNOSIS — N6031 Fibrosclerosis of right breast: Secondary | ICD-10-CM | POA: Diagnosis not present

## 2016-11-10 DIAGNOSIS — N6081 Other benign mammary dysplasias of right breast: Secondary | ICD-10-CM | POA: Diagnosis not present

## 2016-11-10 HISTORY — PX: BREAST BIOPSY: SHX20

## 2016-11-11 ENCOUNTER — Telehealth: Payer: Self-pay | Admitting: General Surgery

## 2016-11-11 LAB — SURGICAL PATHOLOGY

## 2016-11-11 NOTE — Telephone Encounter (Signed)
The patient was notified that both biopsy sites were benign. No  indication for surgical intervention.

## 2016-11-17 ENCOUNTER — Encounter: Payer: Self-pay | Admitting: General Surgery

## 2016-11-17 NOTE — Progress Notes (Signed)
Pathology report reviewed. Benign changes.  Radiologist is recommending formal excision. Postbiopsy mammogram reports areas of concern were sampled.  We'll have presented at tumor board for discussion of radiology recommendation.

## 2016-12-20 ENCOUNTER — Encounter: Payer: Self-pay | Admitting: General Surgery

## 2016-12-20 NOTE — Progress Notes (Signed)
Case presented at [facility] breast conference.  Radiologist reports their standard is to recommend formal excision in the face of negative biopsies even when the area of architectural distortion is been removed.  Patient has now had 4 negative breast biopsies, is BRCA negative and has an elevated risk for breast cancer based on her family history.  The likelihood of identification of a pathologic process in light of the volume of tissue removed and removal of the index area is small.  The patient is a candidate for chemoprevention. She did miss her follow-up appointment and she'll be recontacted to determine if she plans to follow-up through this office or elsewhere.   

## 2017-01-08 ENCOUNTER — Ambulatory Visit: Payer: Managed Care, Other (non HMO) | Admitting: General Surgery

## 2017-01-22 ENCOUNTER — Encounter: Payer: Self-pay | Admitting: General Surgery

## 2017-01-22 ENCOUNTER — Ambulatory Visit (INDEPENDENT_AMBULATORY_CARE_PROVIDER_SITE_OTHER): Payer: Managed Care, Other (non HMO) | Admitting: General Surgery

## 2017-01-22 VITALS — BP 142/78 | HR 93 | Resp 14 | Ht 64.0 in | Wt 186.0 lb

## 2017-01-22 DIAGNOSIS — R928 Other abnormal and inconclusive findings on diagnostic imaging of breast: Secondary | ICD-10-CM | POA: Diagnosis not present

## 2017-01-22 NOTE — Patient Instructions (Addendum)
  Patient to return in 8 months bilateral diagnotic mammogram. The patient is aware to call back for any questions or concerns.

## 2017-01-22 NOTE — Progress Notes (Signed)
Patient ID: Michelle Mejia, female   DOB: 08-22-1960, 56 y.o.   MRN: [id number]  Chief Complaint  Patient presents with  . Follow-up    HPI Michelle Mejia is a 56 y.o. female here today to discuss breast surgery. Patient had a right breast biopsy done on 11/10/2016. Patient states she is doing well.  HPI  Past Medical History:  Diagnosis Date  . BRCA gene mutation negative    Study completed at [facility]  . BRCA negative 02/04/2012   Sample completed by [facility]. Performing laboratory: [facility]. Assession #:[id number] [id number]    Past Surgical History:  Procedure Laterality Date  . APPENDECTOMY    . BREAST BIOPSY Right 07/06/2001   Stereotactic biopsy showing fibrocystic changes, apocrine metaplasia and ductal adenosis, ductal epithelial hyperplasia without atypia.  Marland Kitchen BREAST BIOPSY Left 1994  . BREAST BIOPSY Right 11/10/2016   Affirm Biopsy- path pending  . COLONOSCOPY N/A 04/02/2015   Procedure: COLONOSCOPY;  Surgeon: Manya Silvas, MD;  Location: [facility] ENDOSCOPY;  Service: Endoscopy;  Laterality: N/A;    Family History  Problem Relation Age of Onset  . Breast cancer Mother 3  . Bone cancer Mother   . Ovarian cancer Paternal Aunt   . Breast cancer Maternal Aunt     Social History Social History  Substance Use Topics  . Smoking status: Never Smoker  . Smokeless tobacco: Never Used  . Alcohol use No    Allergies  Allergen Reactions  . Doxycycline Nausea And Vomiting and Other (See Comments)    dizzy  . Phenergan [Promethazine Hcl] Nausea And Vomiting  . Tramadol Nausea And Vomiting and Other (See Comments)    dizzy  . Keflex [Cephalexin] Rash  . Ketek [Telithromycin] Itching and Rash  . Levaquin [Levofloxacin In D5w] Rash  . Nitrofurantoin Monohyd Macro Rash  . Other Rash    Muscle relaxer medication but not flexeril or robaxin pt unsure of the name.   Dolly Rias Dsc [Chlorzoxazone] Rash    Current Outpatient Prescriptions  Medication Sig  Dispense Refill  . fluticasone (FLONASE) 50 MCG/ACT nasal spray Place 2 sprays into both nostrils daily. 16 g 2  . meloxicam (MOBIC) 15 MG tablet Take 15 mg by mouth as needed.      No current facility-administered medications for this visit.     Review of Systems Review of Systems  Constitutional: Negative.   Respiratory: Negative.   Cardiovascular: Negative.     Blood pressure (!) 142/78, pulse 93, resp. rate 14, height 5' 4" (1.626 m), weight 186 lb (84.4 kg).  Physical Exam Physical Exam  Constitutional: She is oriented to person, place, and time. She appears well-developed and well-nourished.  Eyes: Conjunctivae are normal. No scleral icterus.  Neck: Neck supple.  Cardiovascular: Normal rate, regular rhythm and normal heart sounds.   Pulmonary/Chest: Effort normal and breath sounds normal. Right breast exhibits no inverted nipple, no mass, no nipple discharge, no skin change and no tenderness. Left breast exhibits no inverted nipple, no mass, no nipple discharge, no skin change and no tenderness.  Lymphadenopathy:    She has no cervical adenopathy.    She has no axillary adenopathy.  Neurological: She is alert and oriented to person, place, and time.  Skin: Skin is warm and dry.    Data Reviewed Based on her previous biopsies, age, menstrual status and childbearing status her present Baker Janus model risk is 2.9% over 5 years, 20.7% over a lifetime.  Assessment    Modestly  elevated risk for breast cancer based on previous biopsy frequency.    Plan    Pros and cons of chemoprevention were reviewed. Risks associated with tamoxifen including hot flashes, DVT and uterine cancer.  Risks associated with aromatase inhibitor including bone loss. Approximately 1.5% absolute benefit.  The patient was asked to send a by chart message if she desires to give this a try. I did emphasize that she should take any medication for least 1 month, as early vasomotor symptoms don't necessarily  predictable long-term tolerance.    Patient to return in 8 months bilateral diagnotic mammogram. The patient is aware to call back for any questions or concerns.   HPI, Physical Exam, Assessment and Plan have been scribed under the direction and in the presence of Hervey Ard, MD.  Gaspar Cola, CMA  I have completed the exam and reviewed the above documentation for accuracy and completeness.  I agree with the above.  Haematologist has been used and any errors in dictation or transcription are unintentional.  Hervey Ard, M.D., F.A.C.S.  Robert Bellow 01/22/2017, 8:22 PM

## 2017-01-26 ENCOUNTER — Encounter: Payer: Self-pay | Admitting: General Surgery

## 2017-02-16 DIAGNOSIS — R079 Chest pain, unspecified: Secondary | ICD-10-CM

## 2017-02-16 DIAGNOSIS — R0789 Other chest pain: Secondary | ICD-10-CM | POA: Insufficient documentation

## 2017-02-16 DIAGNOSIS — R0602 Shortness of breath: Secondary | ICD-10-CM | POA: Insufficient documentation

## 2017-02-27 ENCOUNTER — Ambulatory Visit: Payer: Self-pay | Admitting: Physician Assistant

## 2017-02-27 DIAGNOSIS — Z299 Encounter for prophylactic measures, unspecified: Secondary | ICD-10-CM

## 2017-02-27 NOTE — Progress Notes (Signed)
Patient came in to have her scheduled flu shot.

## 2017-08-12 ENCOUNTER — Encounter: Payer: Self-pay | Admitting: Obstetrics & Gynecology

## 2017-08-13 ENCOUNTER — Encounter: Payer: Self-pay | Admitting: Obstetrics & Gynecology

## 2017-08-13 ENCOUNTER — Ambulatory Visit (INDEPENDENT_AMBULATORY_CARE_PROVIDER_SITE_OTHER): Payer: Managed Care, Other (non HMO) | Admitting: Obstetrics & Gynecology

## 2017-08-13 VITALS — BP 128/80 | HR 77 | Ht 63.0 in | Wt 178.0 lb

## 2017-08-13 DIAGNOSIS — Z1231 Encounter for screening mammogram for malignant neoplasm of breast: Secondary | ICD-10-CM

## 2017-08-13 DIAGNOSIS — Z1211 Encounter for screening for malignant neoplasm of colon: Secondary | ICD-10-CM

## 2017-08-13 DIAGNOSIS — Z1239 Encounter for other screening for malignant neoplasm of breast: Secondary | ICD-10-CM

## 2017-08-13 DIAGNOSIS — Z0000 Encounter for general adult medical examination without abnormal findings: Secondary | ICD-10-CM

## 2017-08-13 NOTE — Progress Notes (Signed)
HPI:      [name] is a 57 y.o. G1P1001 who LMP was in the past, she presents today for her annual examination.  The patient has no complaints today. The patient is sexually active. Herlast pap: approximate date 2018 and was normal and last mammogram: approximate date 2018 and was abnormal: required right breast biopsy (normal).  The patient does perform self breast exams.  There is notable family history of breast or ovarian cancer in her family. The patient is taking [employer] w some success and is not taking taking hormone replacement therapy. Patient denies post-menopausal vaginal bleeding. No period 2 years.  The patient has regular exercise: yes. The patient denies current symptoms of depression. Weight loss 5 lbs, still trying to lose more weight. Mild GSI, stable from last year.  GYN Hx: Last Colonoscopy:3 years ago. Normal.  Last DEXA: never ago.    PMHx: Past Medical History:  Diagnosis Date  . Abnormal Pap smear of cervix   . BRCA gene mutation negative    Study completed at [facility]  . BRCA negative 02/04/2012   Sample completed by [facility]. Performing laboratory: [facility]. Assession #:[id number] LD  . Cystitis    Past Surgical History:  Procedure Laterality Date  . APPENDECTOMY    . BREAST BIOPSY Right 07/06/2001   Stereotactic biopsy showing fibrocystic changes, apocrine metaplasia and ductal adenosis, ductal epithelial hyperplasia without atypia.  Marland Kitchen BREAST BIOPSY Left 1994  . BREAST BIOPSY Right 11/10/2016   Affirm Biopsy- path pending  . COLONOSCOPY N/A 04/02/2015   Procedure: COLONOSCOPY;  Surgeon: Manya Silvas, MD;  Location: [facility] ENDOSCOPY;  Service: Endoscopy;  Laterality: N/A;  . TONSILLECTOMY     Family History  Problem Relation Age of Onset  . Breast cancer Mother 85  . Bone cancer Mother   . Diabetes Mother   . Breast cancer Maternal Aunt   . Ovarian cancer Maternal Aunt   . Heart disease Father   . Thyroid disease Father     . Colon polyps Sister   . Cancer Sister    Social History   Tobacco Use  . Smoking status: Never Smoker  . Smokeless tobacco: Never Used  Substance Use Topics  . Alcohol use: No    Alcohol/week: 0.0 oz  . Drug use: No    Current Outpatient Medications:  .  fluticasone (FLONASE) 50 MCG/ACT nasal spray, Place 2 sprays into both nostrils daily. (Patient not taking: Reported on 08/13/2017), Disp: 16 g, Rfl: 2 .  meloxicam (MOBIC) 15 MG tablet, Take 15 mg by mouth as needed. , Disp: , Rfl:  Allergies: Doxycycline; Phenergan [promethazine hcl]; Tramadol; Keflex [cephalexin]; Ketek [telithromycin]; Levaquin [levofloxacin in d5w]; Nitrofurantoin monohyd macro; Other; and Parafon forte dsc [chlorzoxazone]  Review of Systems  Constitutional: Negative for chills, fever and malaise/fatigue.  HENT: Negative for congestion, sinus pain and sore throat.   Eyes: Negative for blurred vision and pain.  Respiratory: Negative for cough and wheezing.   Cardiovascular: Negative for chest pain and leg swelling.  Gastrointestinal: Negative for abdominal pain, constipation, diarrhea, heartburn, nausea and vomiting.  Genitourinary: Negative for dysuria, frequency, hematuria and urgency.  Musculoskeletal: Positive for joint pain. Negative for back pain, myalgias and neck pain.  Skin: Negative for itching and rash.  Neurological: Negative for dizziness, tremors and weakness.  Endo/Heme/Allergies: Does not bruise/bleed easily.  Psychiatric/Behavioral: Negative for depression. The patient is not nervous/anxious and does not have insomnia.     Objective: BP 128/80  Pulse 77   Ht '5\' 3"'$  (1.6 m)   Wt 178 lb (80.7 kg)   BMI 31.53 kg/m   Filed Weights   08/13/17 0822  Weight: 178 lb (80.7 kg)   Body mass index is 31.53 kg/m. Physical Exam  Constitutional: She is oriented to person, place, and time. She appears well-developed and well-nourished. No distress.  Genitourinary: Rectum normal, vagina normal  and uterus normal. Pelvic exam was performed with patient supine. There is no rash or lesion on the right labia. There is no rash or lesion on the left labia. Vagina exhibits no lesion. No bleeding in the vagina. Right adnexum does not display mass and does not display tenderness. Left adnexum does not display mass and does not display tenderness. Cervix does not exhibit motion tenderness, lesion, friability or polyp.   Uterus is mobile and midaxial. Uterus is not enlarged or exhibiting a mass.  Genitourinary Comments: No cystocele or POP  HENT:  Head: Normocephalic and atraumatic. Head is without laceration.  Right Ear: Hearing normal.  Left Ear: Hearing normal.  Nose: No epistaxis.  No foreign bodies.  Mouth/Throat: Uvula is midline, oropharynx is clear and moist and mucous membranes are normal.  Eyes: Pupils are equal, round, and reactive to light.  Neck: Normal range of motion. Neck supple. No thyromegaly present.  Cardiovascular: Normal rate and regular rhythm. Exam reveals no gallop and no friction rub.  No murmur heard. Pulmonary/Chest: Effort normal and breath sounds normal. No respiratory distress. She has no wheezes. Right breast exhibits no mass, no skin change and no tenderness. Left breast exhibits no mass, no skin change and no tenderness.  Abdominal: Soft. Bowel sounds are normal. She exhibits no distension. There is no tenderness. There is no rebound.  Musculoskeletal: Normal range of motion.  Neurological: She is alert and oriented to person, place, and time. No cranial nerve deficit.  Skin: Skin is warm and dry.  Psychiatric: She has a normal mood and affect. Judgment normal.  Vitals reviewed.   Assessment: Annual Exam 1. Annual physical exam   2. Screening for breast cancer   3. Screen for colon cancer     Plan:            1.  Cervical Screening-  Pap smear schedule reviewed with patient  2. Breast screening- Exam annually and mammogram scheduled  3. Colonoscopy  every 10 years, Hemoccult testing after age 54  4. Labs yearly at work  5. Counseling for hormonal therapy: none.  Counseled as to types of therapy for hot flashes and night sweats, including Clonidine patch (as she does not do well w pills)     F/U  Return in about 1 year (around 08/14/2018) for Annual.  Barnett Applebaum, MD, [facility], [facility] 08/13/2017  8:47 AM

## 2017-08-13 NOTE — Patient Instructions (Signed)
PAP every three years Mammogram every year    Call [phone] to schedule at [facility] Colonoscopy every 10 years Labs yearly (at work)

## 2017-08-31 ENCOUNTER — Other Ambulatory Visit: Payer: Self-pay

## 2017-08-31 DIAGNOSIS — R928 Other abnormal and inconclusive findings on diagnostic imaging of breast: Secondary | ICD-10-CM

## 2017-11-03 ENCOUNTER — Ambulatory Visit: Payer: PRIVATE HEALTH INSURANCE

## 2017-11-03 ENCOUNTER — Other Ambulatory Visit: Payer: Self-pay

## 2017-11-06 ENCOUNTER — Ambulatory Visit
Admission: RE | Admit: 2017-11-06 | Discharge: 2017-11-06 | Disposition: A | Discharge status: Home / Self Care | Payer: Managed Care, Other (non HMO) | Source: Ambulatory Visit | Attending: General Surgery | Admitting: General Surgery

## 2017-11-06 DIAGNOSIS — R928 Other abnormal and inconclusive findings on diagnostic imaging of breast: Secondary | ICD-10-CM | POA: Insufficient documentation

## 2017-11-10 ENCOUNTER — Encounter: Payer: Self-pay | Admitting: General Surgery

## 2017-11-10 ENCOUNTER — Ambulatory Visit: Payer: Managed Care, Other (non HMO) | Admitting: General Surgery

## 2017-11-10 VITALS — BP 122/80 | HR 79 | Resp 12 | Ht 64.0 in | Wt 182.0 lb

## 2017-11-10 DIAGNOSIS — N6489 Other specified disorders of breast: Secondary | ICD-10-CM | POA: Diagnosis not present

## 2017-11-10 NOTE — Patient Instructions (Signed)
The patient has been asked to return to the office in one year with a bilateral diagnostic mammogram.The patient is aware to call back for any questions or concerns. 

## 2017-11-10 NOTE — Progress Notes (Signed)
Patient ID: Michelle Mejia, female   DOB: April 14, 1961, 57 y.o.   MRN: [id number]  Chief Complaint  Patient presents with  . Follow-up    HPI Michelle Mejia is a 57 y.o. female who presents for a breast evaluation. The most recent mammogram was done on 11/03/2017.  Patient does perform regular self breast checks and gets regular mammograms done.    HPI  Past Medical History:  Diagnosis Date  . Abnormal Pap smear of cervix   . BRCA gene mutation negative    Study completed at [facility]  . BRCA negative 02/04/2012   Sample completed by [facility]. Performing laboratory: [facility]. Assession #:[id number] LD  . Cystitis     Past Surgical History:  Procedure Laterality Date  . APPENDECTOMY    . BREAST BIOPSY Right 07/06/2001   Stereotactic biopsy showing fibrocystic changes, apocrine metaplasia and ductal adenosis, ductal epithelial hyperplasia without atypia.  Marland Kitchen BREAST BIOPSY Right 11/10/2016   benign, dicordant per radiologist  . BREAST CYST ASPIRATION Left 1994  . COLONOSCOPY N/A 04/02/2015   Procedure: COLONOSCOPY;  Surgeon: Manya Silvas, MD;  Location: [facility] ENDOSCOPY;  Service: Endoscopy;  Laterality: N/A;  . TONSILLECTOMY      Family History  Problem Relation Age of Onset  . Breast cancer Mother 41  . Bone cancer Mother   . Diabetes Mother   . Breast cancer Maternal Aunt   . Ovarian cancer Maternal Aunt   . Heart disease Father   . Thyroid disease Father   . Colon polyps Sister   . Cancer Sister     Social History Social History   Tobacco Use  . Smoking status: Never Smoker  . Smokeless tobacco: Never Used  Substance Use Topics  . Alcohol use: No    Alcohol/week: 0.0 oz  . Drug use: No    Allergies  Allergen Reactions  . Doxycycline Nausea And Vomiting and Other (See Comments)    dizzy  . Phenergan [Promethazine Hcl] Nausea And Vomiting  . Tramadol Nausea And Vomiting and Other (See Comments)    dizzy  . Keflex [Cephalexin] Rash  . Ketek  [Telithromycin] Itching and Rash  . Levaquin [Levofloxacin In D5w] Rash  . Nitrofurantoin Monohyd Macro Rash  . Other Rash    Muscle relaxer medication but not flexeril or robaxin pt unsure of the name.   Dolly Rias Dsc [Chlorzoxazone] Rash    Current Outpatient Medications  Medication Sig Dispense Refill  . fluticasone (FLONASE) 50 MCG/ACT nasal spray Place 2 sprays into both nostrils daily. 16 g 2  . meloxicam (MOBIC) 15 MG tablet Take 15 mg by mouth as needed.      No current facility-administered medications for this visit.     Review of Systems Review of Systems  Constitutional: Negative.   Respiratory: Negative.   Cardiovascular: Negative.     Blood pressure 122/80, pulse 79, resp. rate 12, height '5\' 4"'  (1.626 m), weight 182 lb (82.6 kg).  Physical Exam Physical Exam  Constitutional: She is oriented to person, place, and time. She appears well-developed and well-nourished.  Eyes: Conjunctivae are normal. No scleral icterus.  Neck: Neck supple.  Cardiovascular: Normal rate, regular rhythm and normal heart sounds.  Pulmonary/Chest: Effort normal and breath sounds normal. Right breast exhibits no inverted nipple, no mass, no nipple discharge, no skin change and no tenderness. Left breast exhibits no inverted nipple, no mass, no nipple discharge, no skin change and no tenderness.  Lymphadenopathy:    She  has no cervical adenopathy.    She has no axillary adenopathy.  Neurological: She is alert and oriented to person, place, and time.  Skin: Skin is warm and dry.    Data Reviewed Bilateral diagnostic mammogram of November 06, 2017 reviewed.  No interval change.  BI-RADS 4 based on previous diagnosis of radial scar.  No atypia.  Assessment    Radial scar of the right breast, status post vacuum biopsy.    Plan  Options for management reviewed: Surgical excision versus ongoing observation.  With the absence of atypia and no interval change in serial mammography studies  the patient and I have elected to continue observation. The patient has been asked to return to the office in one year with a bilateral diagnostic mammogram. The patient is aware to call back for any questions or concerns.   HPI, Physical Exam, Assessment and Plan have been scribed under the direction and in the presence of Hervey Ard, MD.  Gaspar Cola, CMA  I have completed the exam and reviewed the above documentation for accuracy and completeness.  I agree with the above.  Haematologist has been used and any errors in dictation or transcription are unintentional.  Hervey Ard, M.D., F.A.C.S.  Forest Gleason Dominque Levandowski 11/12/2017, 5:42 AM

## 2017-11-13 LAB — FECAL OCCULT BLOOD, IMMUNOCHEMICAL: Fecal Occult Bld: NEGATIVE

## 2018-01-12 DIAGNOSIS — Z1501 Genetic susceptibility to malignant neoplasm of breast: Secondary | ICD-10-CM

## 2018-01-12 HISTORY — DX: Genetic susceptibility to malignant neoplasm of breast: Z15.01

## 2018-01-18 ENCOUNTER — Ambulatory Visit: Payer: Self-pay | Admitting: Emergency Medicine

## 2018-01-18 VITALS — BP 128/82 | HR 94 | Temp 98.1°F | Resp 12 | Ht 64.0 in | Wt 182.0 lb

## 2018-01-18 DIAGNOSIS — R351 Nocturia: Secondary | ICD-10-CM

## 2018-01-18 DIAGNOSIS — R300 Dysuria: Secondary | ICD-10-CM

## 2018-01-18 DIAGNOSIS — N3001 Acute cystitis with hematuria: Secondary | ICD-10-CM

## 2018-01-18 LAB — POCT URINE PREGNANCY: Preg Test, Ur: NEGATIVE

## 2018-01-18 LAB — POCT URINALYSIS DIPSTICK
Bilirubin, UA: NEGATIVE
Glucose, UA: NEGATIVE
Ketones, UA: NEGATIVE
Leukocytes, UA: NEGATIVE
Nitrite, UA: POSITIVE
Protein, UA: NEGATIVE
Spec Grav, UA: >=1.03 — AB (ref 1.010–1.025)
Urobilinogen, UA: 0.2 E.U./dL
pH, UA: 5 (ref 5.0–8.0)

## 2018-01-18 MED ORDER — SULFAMETHOXAZOLE-TRIMETHOPRIM 200-40 MG/5ML PO SUSP: 20.0000 mL | Freq: Two times a day (BID) | ORAL | 0 refills | Status: AC

## 2018-01-18 NOTE — Progress Notes (Signed)
Patient stated her symptoms started 2 weeks ago but went away and came back this Saturday 01/16/18.

## 2018-01-18 NOTE — Progress Notes (Signed)
Subjective:  This is a 57 y.o. female who presents today with UTI symptoms for 14 days.  + dysuria + frequency + urgency No hematuria No vaginal discharge No fever/chills Positive for suprapubic lower abdominal pain No nausea No vomiting Minimal left and left lower back pain No fatigue She denies chance of pregnancy.  But she is not sure when her last menstrual period was Has tried over-the-counter measures without improvement. After further questioning, she has had recurrent UTIs, about 2-year for several years.  Last one was 2018.  Requests referral to urologist   Objective: Alert, pleasant female no distress.  BP 128/82, pulse 94, temp 98.1 respirations 12 HEENT negative.  No scleral icterus. Lungs equal expansion Heart regular rate Back: No definite CVA tenderness No definite flank tenderness Abdomen: Soft, mild suprapubic tenderness.  No other tenderness or masses or guarding or rebound.  No hepatosplenomegaly. Extremities: No cyanosis clubbing edema or tenderness  Today's labs: Urinalysis positive trace blood positive for nitrite. Urine pregnancy test negative. Urine culture sent.  Assessment: 1.  acute cystitis with hematuria-has been recurrent, averaging 2 or more times each year.  For many years 2.  Chronic nocturia about 3-4 times a night for years.  In my opinion, she has indications for urology referral: Both recurrent UTIs and also chronic nocturia.    Regarding drug allergies: Of note, she has allergies to multiple antibiotics, including Keflex which causes rash, nitrofurantoin caused rash.  Levaquin caused red streaks in her arm after IV Levaquin in the past.  However, she has since taken Cipro without side effects.  I questioned her further.  She has taken Septra in the past without side effects.  Plans:  Treatment options discussed, as well as risks, benefits, alternatives. Patient voiced understanding and agreement with the following plans: An After Visit  Summary was printed and given to the patient. Sending Urine Culture.  . Treating with antibiotic, Septra suspension 20 mL's twice a day for 7 days. you need to establish with PCP .  Advise referral to urologist for recurrent UTIs and also chronic frequent urination at night.- (we will attempt referral to [facility] at your request)  Follow-up with your primary care doctor in 5-7 days if not improving, or sooner if symptoms become worse. Precautions discussed. Red flags discussed. -Discussed what to do if any red flags. Questions invited and answered. Patient voiced understanding and agreement.

## 2018-01-18 NOTE — Addendum Note (Signed)
Addended by: Judie Petit on: 01/18/2018 03:54 PM   Modules accepted: Orders

## 2018-01-18 NOTE — Patient Instructions (Addendum)
You have a urinary tract infection.  Sending Urine Culture. . Treating with antibiotic, Septra suspension 20 mL's twice a day for 7 days you need to establish with PCP . Advise referral to urologist for recurrent UTIs and also chronic frequent urination at night.- (we will attempt referral to [facility] at your request) A urinary tract infection (UTI) is an infection of any part of the urinary tract, which includes the kidneys, ureters, bladder, and urethra. These organs make, store, and get rid of urine in the body. UTI can be a bladder infection (cystitis) or kidney infection (pyelonephritis). What are the causes? This infection may be caused by fungi, viruses, or bacteria. Bacteria are the most common cause of UTIs. This condition can also be caused by repeated incomplete emptying of the bladder during urination. What increases the risk? This condition is more likely to develop if:  You ignore your need to urinate or hold urine for long periods of time.  You do not empty your bladder completely during urination.  You wipe back to front after urinating or having a bowel movement, if you are female.  You are uncircumcised, if you are female.  You are constipated.  You have a urinary catheter that stays in place (indwelling).  You have a weak defense (immune) system.  You have a medical condition that affects your bowels, kidneys, or bladder.  You have diabetes.  You take antibiotic medicines frequently or for long periods of time, and the antibiotics no longer work well against certain types of infections (antibiotic resistance).  You take medicines that irritate your urinary tract.  You are exposed to chemicals that irritate your urinary tract.  You are female.  What are the signs or symptoms? Symptoms of this condition include:  Fever.  Frequent urination or passing small amounts of urine frequently.  Needing to urinate urgently.  Pain or burning with  urination.  Urine that smells bad or unusual.  Cloudy urine.  Pain in the lower abdomen or back.  Trouble urinating.  Blood in the urine.  Vomiting or being less hungry than normal.  Diarrhea or abdominal pain.  Vaginal discharge, if you are female.  How is this diagnosed? This condition is diagnosed with a medical history and physical exam. You will also need to provide a urine sample to test your urine. Other tests may be done, including:  Blood tests.  Sexually transmitted disease (STD) testing.  If you have had more than one UTI, a cystoscopy or imaging studies may be done to determine the cause of the infections. How is this treated? Treatment for this condition often includes a combination of two or more of the following:  Antibiotic medicine.  Other medicines to treat less common causes of UTI.  Over-the-counter medicines to treat pain.  Drinking enough water to stay hydrated.  Follow these instructions at home:  Take over-the-counter and prescription medicines only as told by your health care provider.  If you were prescribed an antibiotic, take it as told by your health care provider. Do not stop taking the antibiotic even if you start to feel better.  Avoid alcohol, caffeine, tea, and carbonated beverages. They can irritate your bladder.  Drink enough fluid to keep your urine clear or pale yellow.  Keep all follow-up visits as told by your health care provider. This is important.  Make sure to: ? Empty your bladder often and completely. Do not hold urine for long periods of time. ? Empty your bladder before and  after sex. ? Wipe from front to back after a bowel movement if you are female. Use each tissue one time when you wipe. Contact a health care provider if:  You have back pain.  You have a fever.  You feel nauseous or vomit.  Your symptoms do not get better after 3 days.  Your symptoms go away and then return. Get help right away  if:  You have severe back pain or lower abdominal pain.  You are vomiting and cannot keep down any medicines or water.

## 2018-01-19 ENCOUNTER — Encounter: Payer: Self-pay | Admitting: Obstetrics and Gynecology

## 2018-01-19 ENCOUNTER — Ambulatory Visit: Payer: Managed Care, Other (non HMO) | Admitting: Obstetrics and Gynecology

## 2018-01-19 VITALS — BP 132/80 | HR 89 | Ht 64.0 in | Wt 182.0 lb

## 2018-01-19 DIAGNOSIS — Z803 Family history of malignant neoplasm of breast: Secondary | ICD-10-CM

## 2018-01-19 DIAGNOSIS — N644 Mastodynia: Secondary | ICD-10-CM

## 2018-01-19 DIAGNOSIS — Z9189 Other specified personal risk factors, not elsewhere classified: Secondary | ICD-10-CM | POA: Insufficient documentation

## 2018-01-19 NOTE — Progress Notes (Signed)
Chief Complaint  Patient presents with  . Breast exam    pain on right breast since friday night, feels pain more when laying on side or pressing on it     HPI:      [name] is a 57 y.o. G1P1001 who LMP was No LMP recorded (lmp unknown). Patient is perimenopausal., presents today for breast symptoms. She complains of RT breast pain medial side Fri and Sat nights when lying down. Pain was a twisting/cramping sensation, lasting about a minute. Area was tender afterwards. Palpation can mimic sx. Had sx again last night but less intense. No trauma/heavy lifting. No sx in upright position. No erythema/nipple d/c. Pt is postmenopausal.   Last mammo 11/06/17 was cat 4 RT breast for architectural distortion. Pt saw Dr. Bary Castilla 11/10/17 and dicsussed surg exc vs ongoing observation. Pt diagnosed with radial scar (lateral aspect) and decided to continue observation.    Strong FH breast cancer in mother and aunt, as well as ovar cancer in her mat aunt. Pt is BRCA neg 2013; no update testing done. IBIS=34.6% 2013. No screening breast MRI done.  Last annual 5/19   Past Medical History:  Diagnosis Date  . Abnormal Pap smear of cervix   . BRCA gene mutation negative    Study completed at [facility]  . BRCA negative 02/04/2012   Sample completed by [facility]. Performing laboratory: [facility]. Assession #:[id number]  . Cystitis   . Family history of breast cancer   . Increased risk of breast cancer 2013   IBIS=34.6%    Past Surgical History:  Procedure Laterality Date  . APPENDECTOMY    . BREAST BIOPSY Right 07/06/2001   Stereotactic biopsy showing fibrocystic changes, apocrine metaplasia and ductal adenosis, ductal epithelial hyperplasia without atypia.  Marland Kitchen BREAST BIOPSY Right 11/10/2016   benign, dicordant per radiologist  . BREAST CYST ASPIRATION Left 1994  . COLONOSCOPY N/A 04/02/2015   Procedure: COLONOSCOPY;  Surgeon: Manya Silvas, MD;  Location: [facility] ENDOSCOPY;   Service: Endoscopy;  Laterality: N/A;  . TONSILLECTOMY      Family History  Problem Relation Age of Onset  . Breast cancer Mother 3  . Bone cancer Mother   . Diabetes Mother   . Breast cancer Maternal Aunt   . Ovarian cancer Maternal Aunt   . Heart disease Father   . Thyroid disease Father   . Colon polyps Sister   . Cancer Sister     Current Outpatient Medications on File Prior to Visit  Medication Sig Dispense Refill  . sulfamethoxazole-trimethoprim (BACTRIM,SEPTRA) 200-40 MG/5ML suspension Take 20 mLs by mouth 2 (two) times daily for 7 days. 150 mL 0  . fluticasone (FLONASE) 50 MCG/ACT nasal spray Place 2 sprays into both nostrils daily. (Patient not taking: Reported on 01/19/2018) 16 g 2  . meloxicam (MOBIC) 15 MG tablet Take 15 mg by mouth as needed.      No current facility-administered medications on file prior to visit.      ROS: Review of Systems  Constitutional: Negative for fatigue, fever and unexpected weight change.  Respiratory: Negative for cough, shortness of breath and wheezing.   Cardiovascular: Negative for chest pain, palpitations and leg swelling.  Gastrointestinal: Negative for blood in stool, constipation, diarrhea, nausea and vomiting.  Endocrine: Negative for cold intolerance, heat intolerance and polyuria.  Genitourinary: Positive for dysuria and frequency. Negative for dyspareunia, flank pain, genital sores, hematuria, menstrual problem, pelvic pain, urgency, vaginal bleeding, vaginal discharge and vaginal  pain.  Musculoskeletal: Negative for back pain, joint swelling and myalgias.  Skin: Negative for rash.  Neurological: Negative for dizziness, syncope, light-headedness, numbness and headaches.  Hematological: Negative for adenopathy.  Psychiatric/Behavioral: Negative for agitation, confusion, sleep disturbance and suicidal ideas. The patient is not nervous/anxious.    Breast ROS: positive for - pain   Objective: BP 132/80   Pulse 89   Ht 5'  4" (1.626 m)   Wt 182 lb (82.6 kg)   LMP  (LMP Unknown) Comment: Possible Pre-Menopausal  BMI 31.24 kg/m    Physical Exam  Constitutional: She is oriented to person, place, and time. She appears well-developed.  Pulmonary/Chest: Right breast exhibits tenderness. Right breast exhibits no inverted nipple, no mass, no nipple discharge and no skin change. Left breast exhibits no inverted nipple, no mass, no nipple discharge, no skin change and no tenderness.  RT MEDIAL ASPECT WITH TENDERNESS TO PALPATION; NO MASSES; STERNUM SLIGHTLY TENDER TO PALPATE AND CAUSES A "TIGHT SENSATION" IN RT BREAST    Neurological: She is alert and oriented to person, place, and time. No cranial nerve deficit.  Skin: Skin is warm and dry.  Psychiatric: She has a normal mood and affect. Her behavior is normal. Thought content normal.  Vitals reviewed.   Assessment/Plan: Breast pain - Sx improving, no masses on exam. Question MSK given sx hx/tenderness to palpate sternum. NSAIDs/heat/ice. F/u if sx persist.  Family history of breast cancer - [facility] update testing done today. Will call with results. [employer]. - Plan: Integrated [facility] [facility])  Increased risk of breast cancer - Plan: Integrated BRACAnalysis ([facility])    F/U  Return if symptoms worsen or fail to improve.  Alicia B. Copland, PA-C 01/19/2018 11:56 AM

## 2018-01-19 NOTE — Patient Instructions (Signed)
I value your feedback and entrusting us with your care. If you get a [facility] patient survey, I would appreciate you taking the time to let us know about your experience today. Thank you! 

## 2018-01-20 LAB — URINE CULTURE

## 2018-01-20 NOTE — Addendum Note (Signed)
Addended by: Judie Petit on: 01/20/2018 03:02 PM   Modules accepted: Miquel Dunn

## 2018-01-22 ENCOUNTER — Telehealth: Payer: Self-pay

## 2018-01-22 NOTE — Progress Notes (Signed)
Called Patient. She did not answer. LVM for her to call back. I will also try to call a little later.

## 2018-01-22 NOTE — Telephone Encounter (Signed)
Patient was contacted about lab results and prescription.

## 2018-01-22 NOTE — Progress Notes (Signed)
Please advise patient: Urine culture grew out E. coli, a common GI bacteria.  The Septra antibiotic prescribed should cure this.  Be sure to take it for the full 7 days

## 2018-01-25 NOTE — Progress Notes (Addendum)
Patient called and stated that the corrected script for the SMZ-TMP 225ml quantity that was called into [facility] Friday 10/11 was more expensive than at the [facility] and she did not pick it up. She requested the script be resent to [facility] and she would pick it up tomorrow. The script has been called into [facility] on [address] 01/25/18

## 2018-02-03 ENCOUNTER — Encounter: Payer: Self-pay | Admitting: Obstetrics and Gynecology

## 2018-02-09 ENCOUNTER — Telehealth: Payer: Self-pay | Admitting: Obstetrics and Gynecology

## 2018-02-09 ENCOUNTER — Encounter: Payer: Self-pay | Admitting: Obstetrics and Gynecology

## 2018-02-09 NOTE — Telephone Encounter (Signed)
Pt aware of BRIP1 mutation on [facility] update testing 10/19. Pt at increased risk for ovar cancer and BSO recommended. Pt interested in BSO and will RTO with Dr. Kenton Kingfisher for consultation appt.   Pt also at increased risk of breast cancer due to [place]. Riskscore=28.6%/IBIS=23.9%. Pt having mammos with Dr. Bary Castilla and being followed for RT breast mass. Last mammo 11/06/17 was Cat 4 but no interval change. Previous bx suggestive of radial scar  Given increased risk of breast cancer due to [place], recommended monthly SBE, yearly CBE and mammos as well as yearly scr breast MRI. Given current abn mammo, discussed adding breast MRI for increased screening/mammo clarification. Pt will call back to schedule (will either do 2019 or early 2020 due to meeting deductible with BSO surg).  Patient understands these results only apply to her and her children, and this is not indicative of genetic testing results of her other family members. It is recommended that her other family members have genetic testing done.  Pt already discussed results with Stefanie Libel, GC, due to [place]. Pt's questions answered.

## 2018-02-15 ENCOUNTER — Ambulatory Visit: Payer: Managed Care, Other (non HMO) | Admitting: Obstetrics & Gynecology

## 2018-02-15 ENCOUNTER — Encounter: Payer: Self-pay | Admitting: Obstetrics & Gynecology

## 2018-02-15 VITALS — BP 120/78 | Ht 64.0 in | Wt 184.0 lb

## 2018-02-15 DIAGNOSIS — Z803 Family history of malignant neoplasm of breast: Secondary | ICD-10-CM | POA: Diagnosis not present

## 2018-02-15 DIAGNOSIS — R898 Other abnormal findings in specimens from other organs, systems and tissues: Secondary | ICD-10-CM | POA: Diagnosis not present

## 2018-02-15 DIAGNOSIS — Z1231 Encounter for screening mammogram for malignant neoplasm of breast: Secondary | ICD-10-CM | POA: Diagnosis not present

## 2018-02-15 DIAGNOSIS — Z9189 Other specified personal risk factors, not elsewhere classified: Secondary | ICD-10-CM

## 2018-02-15 DIAGNOSIS — Z86018 Personal history of other benign neoplasm: Secondary | ICD-10-CM

## 2018-02-15 DIAGNOSIS — Z1589 Genetic susceptibility to other disease: Secondary | ICD-10-CM | POA: Insufficient documentation

## 2018-02-15 NOTE — Progress Notes (Signed)
History of Present Illness:  Michelle Mejia is a 57 y.o. G1P1 WF who recently underwent labwork for genetic carrier status for cancer risk.  She presents for discussion of results and plan of management. 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  Options for management such as BSO desired.  Pt has been menopausal for 2 years, min menopausal sx's.  She has had h/o fibroids in past.  Last [name] was 5 years ago.  She has h/o laparoscopy for ruptured appendix.  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 Also,  has a past surgical history that includes Appendectomy; Colonoscopy (N/A, 04/02/2015); Tonsillectomy; Breast cyst aspiration (Left, 1994); Breast biopsy (Right, 07/06/2001); and Breast biopsy (Right, 11/10/2016)., family history includes Bone cancer in her mother; Breast cancer in her maternal aunt; Breast cancer (age of onset: 41) in her mother; Cancer in her sister; Colon polyps in her sister; Diabetes in her mother; Heart disease in her father; Ovarian cancer in her maternal aunt; Thyroid disease in her father.,  reports that she has never smoked. She has never used smokeless tobacco. She reports that she does not drink alcohol or use drugs. No outpatient medications have been marked as taking for the 02/15/18 encounter (Office Visit) with Gae Dry, MD.  . Also, is allergic to doxycycline; phenergan [promethazine hcl]; tramadol; keflex [cephalexin]; ketek [telithromycin]; levaquin [levofloxacin in d5w]; nitrofurantoin monohyd macro; other; and parafon forte dsc [chlorzoxazone]..  Review of Systems  Constitutional: Negative for chills, fever and malaise/fatigue.  HENT: Negative for congestion, sinus pain and sore throat.   Eyes: Negative for blurred vision  and pain.  Respiratory: Negative for cough and wheezing.   Cardiovascular: Negative for chest pain and leg swelling.  Gastrointestinal: Negative for abdominal pain, constipation, diarrhea, heartburn, nausea and vomiting.  Genitourinary: Negative for dysuria, frequency, hematuria and urgency.  Musculoskeletal: Negative for back pain, joint pain, myalgias and neck pain.  Skin: Negative for itching and rash.  Neurological: Negative for dizziness, tremors and weakness.  Endo/Heme/Allergies: Does not bruise/bleed easily.  Psychiatric/Behavioral: Negative for depression. The patient is not nervous/anxious and does not have insomnia.    Physical Exam:  BP 120/78   Ht '5\' 4"'$  (1.626 m)   Wt 184 lb (83.5 kg)   LMP  (LMP Unknown) Comment: Possible Pre-Menopausal  BMI 31.58 kg/m  Body mass index is 31.58 kg/m. Constitutional: Well nourished, well developed female in no acute distress.  Abdomen: diffusely non tender to palpation, non distended, and no masses, hernias Neuro: Grossly intact Psych:  Normal mood and affect.    Assessment:  Problem List Items Addressed This Visit    High risk of ovarian cancer   Relevant Orders   US PELVIS TRANSVANGINAL NON-OB (TV ONLY)   Family history of breast cancer - Primary   Abnormal genetic test   History of uterine fibroid   Relevant Orders   US PELVIS TRANSVANGINAL NON-OB (TV ONLY)    Other Visit Diagnoses    Encounter for screening mammogram for malignant neoplasm of breast       Relevant Orders   MR BREAST BILATERAL W [place] CAD   At high risk for breast cancer         Plan: Detailed discussion of results today. Options for management discussed. Info provided. At  this time, we will plan for [name] to assess for fibroids and look at ovaries.  Option for Lap BSO vs Lap Hysterectomy w BSO discussed w pros and cons of each choice counseled. She desires surgery in January.  Since the current test is not perfect, it is possible that there may  be a gene mutation that current testing cannot detect, but that chance is small. It is possible that a different genetic factor, which has not yet been discovered or is not on this panel, is responsible for the cancer diagnoses in the family. Again, the likelihood of this is low.   Most cancers happen by chance and this test, along with details of her family history, suggests that her cancer falls into this category. She is recommended to follow the cancer screening guidelines provided by her physician.  Due to her risk for breast cancer, TC score>20%, it is also recommended she have MR Breast done annual or biannual.  She has not had this done before.  WIll schedule.   A total of 20 minutes were spent face-to-face with the patient during this encounter and over half of that time dealt with counseling and coordination of care.  Barnett Applebaum, MD, [facility], [facility] 02/15/2018  9:26 AM

## 2018-02-15 NOTE — Patient Instructions (Signed)
Bilateral Salpingo-Oophorectomy Bilateral salpingo-oophorectomy is the surgical removal of both fallopian tubes and both ovaries. The ovaries are reproductive organs that produce eggs in women. The fallopian tubes allow eggs to move from the ovaries to the uterus. You may need this procedure if you:  Have had your uterus removed. This procedure is usually done after the uterus is removed.  Have cancer of the fallopian tubes or ovaries.  Have a high risk of cancer of the fallopian tubes or ovaries.  There are three different techniques that can be used for this procedure:  Open. One large incision will be made in your abdomen.  Laparoscopic. A thin, lighted tube with a small camera on the end (laparoscope) will be used to help perform the procedure. The laparoscope will allow your surgeon to make several small incisions in the abdomen instead of one large incision.  Robot-assisted. A computer will be used to control surgical instruments that are attached to robotic arms. A laparoscope may also be used with this technique.  As a result of this procedure, you will become sterile (unable to become pregnant), and you will go into menopause (no longer able to have menstrual periods). You may develop symptoms of menopause such as hot flashes, night sweats, and mood changes. Your sex drive may also be affected. Tell a health care provider about:  Any allergies you have.  All medicines you are taking, including vitamins, herbs, eye drops, creams, and over-the-counter medicines.  Any problems you or family members have had with anesthetic medicines.  Any blood disorders you have.  Any surgeries you have had.  Any medical conditions you have.  Whether you are pregnant or may be pregnant. What are the risks? Generally, this is a safe procedure. However, problems may occur, including:  Infection.  Bleeding.  Allergic reactions to medicines.  Damage to other structures or  organs.  Blood clots in the legs or lungs.  What happens before the procedure? Staying hydrated Follow instructions from your health care provider about hydration, which may include:  Up to 2 hours before the procedure - you may continue to drink clear liquids, such as water, clear fruit juice, black coffee, and plain tea.  Eating and drinking restrictions Follow instructions from your health care provider about eating and drinking, which may include:  8 hours before the procedure - stop eating heavy meals or foods such as meat, fried foods, or fatty foods.  6 hours before the procedure - stop eating light meals or foods, such as toast or cereal.  6 hours before the procedure - stop drinking milk or drinks that contain milk.  2 hours before the procedure - stop drinking clear liquids.  Medicines  Ask your health care provider about: ? Changing or stopping your regular medicines. This is especially important if you are taking diabetes medicines or blood thinners. ? Taking medicines such as aspirin and ibuprofen. These medicines can thin your blood. Do not take these medicines before your procedure if your health care provider instructs you not to.  You may be given antibiotic medicine to help prevent infection. General instructions  Do not smoke for at least 2 weeks before your procedure or as told by your health care provider.  You may have an exam or testing.  You may have a blood or urine sample taken.  Ask your health care provider how your surgical site will be marked or identified.  Plan to have someone take you home from the hospital.  If you  will be going home right after the procedure, plan to have someone with you for 24 hours. What happens during the procedure?  To reduce your risk of infection: ? Your health care team will wash or sanitize their hands. ? Your skin will be washed with soap. ? Hair may be removed from the surgical area.  An IV tube will be  inserted into one of your veins.  You will be given one or more of the following: ? A medicine to help you relax (sedative). ? A medicine to make you fall asleep (general anesthetic).  A thin tube (catheter) will be inserted through your urethra and into your bladder. The catheter drains urine during your procedure.  Depending on the type of surgery you are having, your surgeon will do one of the following: ? Make one incision in your abdomen (open surgery). ? Make two small incisions in your abdomen (laparoscopic surgery). The laparoscope will be passed through one incision, and surgical instruments will be passed through the other. ? Make several small incisions in your abdomen (robot-assisted surgery). A laparoscope and other surgical instruments may be passed through the incisions.  Your fallopian tubes and ovaries will be cut away from the uterus and removed.  Your blood vessels will be clamped and tied to prevent too much bleeding.  The incision(s) in your abdomen will be closed with stitches (sutures) or staples.  A bandage (dressing) may be placed over your incision(s). The procedure may vary among health care providers and hospitals. What happens after the procedure?  Your blood pressure, heart rate, breathing rate, and blood oxygen level will be monitored until the medicines you were given have worn off.  You may continue to receive fluids and medicines through an IV tube.  You may continue to have a catheter draining your urine.  You may have to wear compression stockings. These stockings help to prevent blood clots and reduce swelling in your legs.  You will be given pain medicine as needed.  Do not drive for 24 hours if you received a sedative. Summary  Bilateral salpingo-oophorectomy is a procedure to remove both fallopian tubes and both ovaries.  There are three different techniques that can be used for this procedure, including open, laparoscopic, and robotic.  Talk with your health care provider about how your procedure will be done.  As a result of this procedure, you will become sterile and you will go into menopause.  Plan to have someone take you home from the hospital. This information is not intended to replace advice given to you by your health care provider. Make sure you discuss any questions you have with your health care provider. Document Released: 03/31/2005 Document Revised: 05/05/2016 Document Reviewed: 05/05/2016 Elsevier Interactive Patient Education  [employer].

## 2018-02-17 ENCOUNTER — Ambulatory Visit: Payer: Managed Care, Other (non HMO) | Admitting: Obstetrics & Gynecology

## 2018-02-17 ENCOUNTER — Encounter: Payer: Self-pay | Admitting: Obstetrics & Gynecology

## 2018-02-17 ENCOUNTER — Ambulatory Visit (INDEPENDENT_AMBULATORY_CARE_PROVIDER_SITE_OTHER): Payer: Managed Care, Other (non HMO)

## 2018-02-17 VITALS — BP 128/80 | Ht 64.0 in | Wt 182.0 lb

## 2018-02-17 DIAGNOSIS — D219 Benign neoplasm of connective and other soft tissue, unspecified: Secondary | ICD-10-CM | POA: Diagnosis not present

## 2018-02-17 DIAGNOSIS — R898 Other abnormal findings in specimens from other organs, systems and tissues: Secondary | ICD-10-CM | POA: Diagnosis not present

## 2018-02-17 DIAGNOSIS — Z9189 Other specified personal risk factors, not elsewhere classified: Secondary | ICD-10-CM

## 2018-02-17 DIAGNOSIS — D250 Submucous leiomyoma of uterus: Secondary | ICD-10-CM | POA: Diagnosis not present

## 2018-02-17 DIAGNOSIS — D251 Intramural leiomyoma of uterus: Secondary | ICD-10-CM

## 2018-02-17 DIAGNOSIS — Z86018 Personal history of other benign neoplasm: Secondary | ICD-10-CM

## 2018-02-17 NOTE — Progress Notes (Signed)
  HPI: Pt has ovarian cancer risk and is palnning risk reduction surgery by way of BSO.  Has some bladder pressure at times, and freq.  No VB.   Ultrasound demonstrates 3 fibroids largest measuring 5.5cm/ These findings are Pelvis fibroid uterus that may relate to her bladder pressure sx's.  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 Also,  has a past surgical history that includes Appendectomy; Colonoscopy (N/A, 04/02/2015); Tonsillectomy; Breast cyst aspiration (Left, 1994); Breast biopsy (Right, 07/06/2001); and Breast biopsy (Right, 11/10/2016)., family history includes Bone cancer in her mother; Breast cancer in her maternal aunt; Breast cancer (age of onset: 69) in her mother; Cancer in her sister; Colon polyps in her sister; Diabetes in her mother; Heart disease in her father; Ovarian cancer in her maternal aunt; Thyroid disease in her father.,  reports that she has never smoked. She has never used smokeless tobacco. She reports that she does not drink alcohol or use drugs.  She has a current medication list which includes the following prescription(s): fluticasone and meloxicam. Also, is allergic to doxycycline; phenergan [promethazine hcl]; tramadol; keflex [cephalexin]; ketek [telithromycin]; levaquin [levofloxacin in d5w]; nitrofurantoin monohyd macro; other; and parafon forte dsc [chlorzoxazone].  Review of Systems  All other systems reviewed and are negative.  Objective: BP 128/80   Ht '5\' 4"'$  (1.626 m)   Wt 182 lb (82.6 kg)   LMP  (LMP Unknown) Comment: Possible Pre-Menopausal  BMI 31.24 kg/m   Physical examination Constitutional NAD, Conversant  Skin No rashes, lesions or ulceration.   Extremities: Moves all appropriately.  Normal ROM for age. No lymphadenopathy.  Neuro: Grossly intact    Psych: Oriented to PPT.  Normal mood. Normal affect.   Assessment:  Abnormal genetic test High risk of ovarian cancer Fibroid    Options for fibroid management discussed and it may cause future sx's even if min sx now other than bladder pressure.  TLH BSO vs Lap BSO are her options.  Recovery disucssed for each.  Plans in Jan 2020.  Info gv.  A total of 15 minutes were spent face-to-face with the patient during this encounter and over half of that time dealt with counseling and coordination of care.    Barnett Applebaum, MD, [facility], [facility] 02/17/2018  3:11 PM

## 2018-02-17 NOTE — Patient Instructions (Signed)
Total Laparoscopic Hysterectomy °A total laparoscopic hysterectomy is a minimally invasive surgery to remove your uterus and cervix. This surgery is performed by making several small cuts (incisions) in your abdomen. It can also be done with a thin, lighted tube (laparoscope) inserted into two small incisions in your lower abdomen. Your fallopian tubes and ovaries can be removed (bilateral salpingo-oophorectomy) during this surgery as well. Benefits of minimally invasive surgery include: °· Less pain. °· Less risk of blood loss. °· Less risk of infection. °· Quicker return to normal activities. ° °Tell a health care provider about: °· Any allergies you have. °· All medicines you are taking, including vitamins, herbs, eye drops, creams, and over-the-counter medicines. °· Any problems you or family members have had with anesthetic medicines. °· Any blood disorders you have. °· Any surgeries you have had. °· Any medical conditions you have. °What are the risks? °Generally, this is a safe procedure. However, as with any procedure, complications can occur. Possible complications include: °· Bleeding. °· Blood clots in the legs or lung. °· Infection. °· Injury to surrounding organs. °· Problems with anesthesia. °· Early menopause symptoms (hot flashes, night sweats, insomnia). °· Risk of conversion to an open abdominal incision. ° °What happens before the procedure? °· Ask your health care provider about changing or stopping your regular medicines. °· Do not take aspirin or blood thinners (anticoagulants) for 1 week before the surgery or as told by your health care provider. °· Do not eat or drink anything for 8 hours before the surgery or as told by your health care provider. °· Quit smoking if you smoke. °· Arrange for a ride home after surgery and for someone to help you at home during recovery. °What happens during the procedure? °· You will be given antibiotic medicine. °· An IV tube will be placed in your arm. You  will be given medicine to make you sleep (general anesthetic). °· A gas (carbon dioxide) will be used to inflate your abdomen. This will allow your surgeon to look inside your abdomen, perform your surgery, and treat any other problems found if necessary. °· Three or four small incisions (often less than 1/2 inch) will be made in your abdomen. One of these incisions will be made in the area of your belly button (navel). The laparoscope will be inserted into the incision. Your surgeon will look through the laparoscope while doing your procedure. °· Other surgical instruments will be inserted through the other incisions. °· Your uterus may be removed through your vagina or cut into small pieces and removed through the small incisions. °· Your incisions will be closed. °What happens after the procedure? °· The gas will be released from inside your abdomen. °· You will be taken to the recovery area where a nurse will watch and check your progress. Once you are awake, stable, and taking fluids well, without other problems, you will return to your room or be allowed to go home. °· There is usually minimal discomfort following the surgery because the incisions are so small. °· You will be given pain medicine while you are in the hospital and for when you go home. °This information is not intended to replace advice given to you by your health care provider. Make sure you discuss any questions you have with your health care provider. °Document Released: 01/26/2007 Document Revised: 09/06/2015 Document Reviewed: 10/19/2012 °Elsevier Interactive Patient Education © 2017 [employer] ° °

## 2018-02-19 ENCOUNTER — Telehealth: Payer: Self-pay | Admitting: Obstetrics & Gynecology

## 2018-02-19 NOTE — Telephone Encounter (Signed)
Patient is aware of H&P at [facility] on 04/30/18 @ 8:00am, Pre-admit Testing phone interview to be scheduled, and OR on 05/06/18. Patient is aware she may receive calls from the [employer] and [facility] Patient confirmed Michelle Mejia, and no secondary insurance. Ext given.

## 2018-02-19 NOTE — Telephone Encounter (Signed)
-----   Message from Gae Dry, MD sent at 02/17/2018  3:15 PM EST ----- Regarding: SURGERY Surgery Booking Request Patient Full Name:  Michelle Mejia  MRN: [id number]  DOB: 1960/06/13  Surgeon: Hoyt Koch, MD  Requested Surgery Date and Time: 05/06/2018 Primary Diagnosis AND Code: Fibroid uterus, Gene risk for ovarian cancer, Family history for ovarian cancer Secondary Diagnosis and Code:  Surgical Procedure: TLH BSO CYSTOSCOPY L&D Notification: No Admission Status: same day surgery Length of Surgery: 1.3 hr Special Case Needs: no H&P: yes (date) Phone Interview???: yes Interpreter: Language:  Medical Clearance: no Special Scheduling Instructions: no

## 2018-02-23 ENCOUNTER — Encounter: Payer: Self-pay | Admitting: Urology

## 2018-02-23 ENCOUNTER — Ambulatory Visit (INDEPENDENT_AMBULATORY_CARE_PROVIDER_SITE_OTHER): Payer: Managed Care, Other (non HMO) | Admitting: Urology

## 2018-02-23 VITALS — BP 112/78 | HR 80 | Ht 64.0 in | Wt 180.8 lb

## 2018-02-23 DIAGNOSIS — N8111 Cystocele, midline: Secondary | ICD-10-CM

## 2018-02-23 DIAGNOSIS — R3915 Urgency of urination: Secondary | ICD-10-CM

## 2018-02-23 DIAGNOSIS — R351 Nocturia: Secondary | ICD-10-CM

## 2018-02-23 LAB — URINALYSIS, COMPLETE
Bilirubin, UA: NEGATIVE
Glucose, UA: NEGATIVE
Ketones, UA: NEGATIVE
Leukocytes, UA: NEGATIVE
Nitrite, UA: NEGATIVE
Protein, UA: NEGATIVE
Specific Gravity, UA: >1.03 — ABNORMAL HIGH (ref 1.005–1.030)
Urobilinogen, Ur: 0.2 mg/dL (ref 0.2–1.0)
pH, UA: 5.5 (ref 5.0–7.5)

## 2018-02-23 LAB — MICROSCOPIC EXAMINATION
RBC, UA: NONE SEEN /hpf (ref 0–2)
WBC, UA: NONE SEEN /hpf (ref 0–5)

## 2018-02-23 LAB — BLADDER SCAN AMB NON-IMAGING: Scan Result: 0

## 2018-02-23 MED ORDER — MIRABEGRON ER 25 MG PO TB24: 25.0000 mg | ORAL_TABLET | Freq: Every day | ORAL | 0 refills | Status: DC

## 2018-02-23 NOTE — Progress Notes (Signed)
02/23/2018 9:51 AM   Michelle Mejia September 16, 1960 [id number]  Referring provider: Jacqulyn Cane, MD [place], [place] [address]  No chief complaint on file.   HPI: Patient is a 57 -year-old Caucasian female who is referred to [name] by Dr. Jacqulyn Cane for recurrent urinary tract infections and nocturia.  Patient states that she has had 2 to 3 urinary tract infections over the last year and this has been occurring for the last several years.  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    Reviewing her records,  she has had one documented positive urine culture over the last year. Positive for E. coli on January 18, 2018  Her symptoms with a urinary tract infection consist of malodorous urine, dysuria and bad bladder spasms during the urinary stream and worsens at the end.   She states she sees a pink tint on the toilet paper once in a great while.  She denies suprapubic pain, back pain, abdominal pain or flank pain associated with UTI's.   She has not had any recent fevers, chills, nausea or vomiting associated with UTI's.   She has had a remote history of nephrolithiasis for which she spontaneously passed. She does not have a history of GU surgery or GU trauma.   Her sister and father have had stones.    She is sexually active.  She has noted a correlation with her urinary tract infections and sexual intercourse.   She does not engage in anal sex.   She is voiding before and after sex.   She is postmenopausal.   She admits to mild constipation.     She does engage in good perineal hygiene. She does take tub baths three times a week.   She has baseline frequency x 4, strong urgency, nocturia x 1-3 and SUI and UI (wears a light pad every day).  She has a constant suprapubic pressure.   Patient denies any gross hematuria, dysuria or suprapubic/flank pain.  Patient  denies any fevers, chills, nausea or vomiting.   Her UA today is positive for moderate bacteria.   Her PVR is 0 mL.    She drinks water occasionally.  She drinks 32 ounces of half and half tea daily.  She is drinking 16 ounces of lemonade/flavored water in the evening.  She has an occasional diet soda.  She does not drink coffee.  She does not drink alcohol.  She does not drink energy drinks.       PMH: Past Medical History:  Diagnosis Date  . Abnormal Pap smear of cervix   . BRCA negative 02/04/2012   Sample completed by [facility]. Performing laboratory: [facility]. Assession #:[id number]  . Cystitis   . Family history of breast cancer   . Increased risk of breast cancer 2013, 2019   riskscore=28.6% due to [place] (not BRIP1 mutation)  . 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 APPENDECTOMY    . BREAST BIOPSY Right 07/06/2001   Stereotactic biopsy showing fibrocystic changes, apocrine metaplasia and ductal adenosis, ductal epithelial hyperplasia without atypia.  Marland Kitchen BREAST BIOPSY Right 11/10/2016   benign, dicordant per radiologist  . BREAST CYST ASPIRATION Left 1994  . COLONOSCOPY N/A 04/02/2015   Procedure:  COLONOSCOPY;  Surgeon: Manya Silvas, MD;  Location: [facility];  Service: Endoscopy;  Laterality: N/A;  . TONSILLECTOMY      Home Medications:  Allergies as of 02/23/2018      Reactions   Doxycycline Nausea And Vomiting, Other (See Comments)   dizzy   Phenergan [promethazine Hcl] Nausea And Vomiting   Tramadol Nausea And Vomiting, Other (See Comments)   dizzy   Keflex [cephalexin] Rash   Ketek [telithromycin] Itching, Rash   Levaquin [levofloxacin In D5w] Rash   Nitrofurantoin Monohyd Macro Rash   Other Rash   Muscle relaxer medication but not flexeril or robaxin pt unsure of the name.    Parafon Forte Dsc [chlorzoxazone] Rash       Medication List        Accurate as of 02/23/18  9:51 AM. Always use your most recent med list.          fluticasone 50 MCG/ACT nasal spray Commonly known as:  FLONASE Place 2 sprays into both nostrils daily.   meloxicam 15 MG tablet Commonly known as:  MOBIC Take 15 mg by mouth as needed.       Allergies:  Allergies  Allergen Reactions  . Doxycycline Nausea And Vomiting and Other (See Comments)    dizzy  . Phenergan [Promethazine Hcl] Nausea And Vomiting  . Tramadol Nausea And Vomiting and Other (See Comments)    dizzy  . Keflex [Cephalexin] Rash  . Ketek [Telithromycin] Itching and Rash  . Levaquin [Levofloxacin In D5w] Rash  . Nitrofurantoin Monohyd Macro Rash  . Other Rash    Muscle relaxer medication but not flexeril or robaxin pt unsure of the name.   Dolly Rias Dsc [Chlorzoxazone] Rash    Family History: Family History  Problem Relation Age of Onset  . Breast cancer Mother 50  . Bone cancer Mother   . Diabetes Mother   . Breast cancer Maternal Aunt   . Ovarian cancer Maternal Aunt   . Heart disease Father   . Thyroid disease Father   . Colon polyps Sister   . Cancer Sister     Social History:  reports that she has never smoked. She has never used smokeless tobacco. She reports that she does not drink alcohol or use drugs.  ROS: UROLOGY Frequent Urination?: Yes Hard to postpone urination?: Yes Burning/pain with urination?: Yes Get up at night to urinate?: Yes Leakage of urine?: Yes Urine stream starts and stops?: No Trouble starting stream?: No Do you have to strain to urinate?: No Blood in urine?: No Urinary tract infection?: Yes Sexually transmitted disease?: No Injury to kidneys or bladder?: No Painful intercourse?: No Weak stream?: No Currently pregnant?: No Vaginal bleeding?: No Last menstrual period?: n  Gastrointestinal Nausea?: No Vomiting?: No Indigestion/heartburn?: Yes Diarrhea?: No Constipation?:  No  Constitutional Fever: No Night sweats?: Yes Weight loss?: No Fatigue?: Yes  Skin Skin rash/lesions?: No Itching?: No  Eyes Blurred vision?: No Double vision?: No  Ears/Nose/Throat Sore throat?: No Sinus problems?: No  Hematologic/Lymphatic Swollen glands?: No Easy bruising?: No  Cardiovascular Leg swelling?: No Chest pain?: Yes  Respiratory Cough?: No Shortness of breath?: No  Endocrine Excessive thirst?: No  Musculoskeletal Back pain?: No Joint pain?: Yes  Neurological Headaches?: No Dizziness?: No  Psychologic Depression?: No Anxiety?: No  Physical Exam: BP 112/78 (BP Location: Left Arm, Patient Position: Sitting, Cuff Size: Normal)   Pulse 80   Ht _0  (1.626 m)   Wt 180 lb 12.8 oz (82  kg)   BMI 31.03 kg/m   Constitutional:  Well nourished. Alert and oriented, No acute distress. HEENT: [place] AT, moist mucus membranes.  Trachea midline, no masses. Cardiovascular: No clubbing, cyanosis, or edema. Respiratory: Normal respiratory effort, no increased work of breathing. GI: Abdomen is soft, non tender, non distended, no abdominal masses. Liver and spleen not palpable.  No hernias appreciated.  Stool sample for occult testing is not indicated.   GU: No CVA tenderness.  No bladder fullness or masses.  Normal external genitalia, normal pubic hair distribution, no lesions.  Normal urethral meatus, no lesions, no prolapse, no discharge.   No urethral masses, tenderness and/or tenderness. No bladder fullness, tenderness or masses. Normal vagina mucosa, good estrogen effect, no discharge, no lesions, good pelvic support, Grade I cystocele and no rectocele noted.  No cervical motion tenderness.  Uterus is freely mobile and non-fixed.  No adnexal/parametria masses or tenderness noted.  Anus and perineum are without rashes or lesions.    Skin: No rashes, bruises or suspicious lesions. Lymph: No cervical or inguinal adenopathy. Neurologic: Grossly intact, no focal  deficits, moving all 4 extremities. Psychiatric: Normal mood and affect.  Laboratory Data: Lab Results  Component Value Date   WBC 6.8 08/20/2016   HGB 15.0 08/20/2016   HCT 44.7 08/20/2016   MCV 88 08/20/2016   PLT 323 08/20/2016    Lab Results  Component Value Date   CREATININE 0.95 08/20/2016    No results found for: PSA  No results found for: TESTOSTERONE  No results found for: HGBA1C  Lab Results  Component Value Date   TSH 2.090 08/20/2016       Component Value Date/Time   CHOL 211 (H) 08/20/2016 0848   HDL 65 08/20/2016 0848   CHOLHDL 3.2 08/20/2016 0848   LDLCALC 135 (H) 08/20/2016 0848    Lab Results  Component Value Date   AST 21 08/20/2016   Lab Results  Component Value Date   ALT 24 08/20/2016   No components found for: ALKALINEPHOPHATASE No components found for: BILIRUBINTOTAL  No results found for: ESTRADIOL  Urinalysis Moderate bacteria.  See Epic. I have reviewed the labs.   Pertinent Imaging: Results for Corvera, EMANI TAUSSIG (MRN [id number]) as of 02/23/2018 09:17  Ref. Range 02/23/2018 08:54  Scan Result Unknown 0    Assessment & Plan:    1. Urgency Criteria for recurrent UTI has not been met with 2 or more infections in 6 months or 3 or greater infections in one year  Patient is instructed to increase their water intake until the urine is pale yellow or clear (10 to 12 cups daily) Avoid soaking in tubs and wipe front to back after urinating  Discussed behavioral therapies, bladder training and bladder control strategies Pelvic floor muscle training - refer to PT Offered medical therapy with anticholinergic therapy or beta-3 adrenergic receptor agonist and the potential side effects of each therapy - would like to try the beta-3 adrenergic receptor agonist (Myrbetriq).  Given Myrbetriq 25 mg samples, #28.  I have reviewed with the patient of the side effects of Myrbetriq, such as: elevation in BP, urinary retention and/or HA.   UA  moderate bacteria - will send for culture to rule out indolent infection RTC in 3 weeks for PVR and OAB questionnaire                              2. Nocturia I explained to  the patient that nocturia is often multi-factorial and difficult to treat.  Sleeping disorders, heart conditions, peripheral vascular disease, diabetes, an enlarged prostate for men, an urethral stricture causing bladder outlet obstruction and/or certain medications can contribute to nocturia. I have suggested that the patient avoid caffeine after noon and alcohol in the evening.  He or she may also benefit from fluid restrictions after 6:00 in the evening and voiding just prior to bedtime. I have explained that research studies have showed that over 84% of patients with sleep apnea reported frequent nighttime urination.   With sleep apnea, oxygen decreases, carbon dioxide increases, the blood become more acidic, the heart rate drops and blood vessels in the lung constrict.  The body is then alerted that something is very wrong. The sleeper must wake enough to reopen the airway. By this time, the heart is racing and experiences a false signal of fluid overload. The heart excretes a hormone-like protein that tells the body to get rid of sodium and water, resulting in nocturia. I also informed the patient that a recent study noted that decreasing sodium intake to 2.3 grams daily, if they don't have issues with hyponatremia, can also reduce the number of nightly voids There is also an increased incidence in sleep apnea with menopause, symptoms include night sweats, daytime sleepiness, depressed mood, and cognitive complaints like poor concentration or problems with short-term memory  The patient may benefit from a discussion with his or her primary care physician to see if he or she has risk factors for sleep apnea or other sleep disturbances and obtaining a sleep study.  3. Cystocele See above   Return in about 3 weeks (around  03/16/2018) for PVR and OAB questionnaire.  These notes generated with voice recognition software. I apologize for typographical errors.  Zara Council, PA-C  [facility] [address]  [place], [place] [address] [phone]

## 2018-02-24 LAB — BUN+CREAT
BUN/Creatinine Ratio: 22 (ref 9–23)
BUN: 19 mg/dL (ref 6–24)
Creatinine, Ser: 0.86 mg/dL (ref 0.57–1.00)
GFR calc Af Amer: 88 mL/min/{1.73_m2} (ref >59)
GFR calc non Af Amer: 76 mL/min/{1.73_m2} (ref >59)

## 2018-02-26 LAB — CULTURE, URINE COMPREHENSIVE

## 2018-03-01 ENCOUNTER — Ambulatory Visit: Payer: Self-pay | Admitting: Urology

## 2018-03-03 ENCOUNTER — Telehealth: Payer: Self-pay | Admitting: *Deleted

## 2018-03-03 MED ORDER — CLINDAMYCIN HCL 300 MG PO CAPS: 300.0000 mg | ORAL_CAPSULE | Freq: Three times a day (TID) | ORAL | 0 refills | Status: AC

## 2018-03-03 NOTE — Telephone Encounter (Signed)
Send Clindamycin 300mg  TID for seven days to  [facility]

## 2018-03-03 NOTE — Telephone Encounter (Signed)
-----   Message from Nori Riis, PA-C sent at 03/03/2018  8:23 AM EST ----- Please let Michelle Mejia know that her urine culture was positive for infection.  The bacteria is susceptible to penicillins, but she is allergic to Keflex.   Is she able to take Augmentin?  If not, she will need to take Clindamycin 300 mg tid for seven days.

## 2018-03-18 NOTE — Progress Notes (Signed)
03/19/2018  9:12 AM   Michelle Mejia 28-Jul-1960 [id number]  Referring provider: No referring provider defined for this encounter.  Chief Complaint  Patient presents with  . Follow-up  . Urinary Frequency    HPI:  Michelle Mejia is a 57 yo Caucasian F with a personal history of urgency, nocturia and cystocele who returns today for the evaluation and management of urinary urgency. Her last visit with [name] was on 02/23/2018.   She reports improvement in urgency and burning when urinating. The pt started on Myrbetriq 25 mg and discontinued use once she started on antibiotics. She does not have a PCP so she was not able to discuss about potential sleep apnea which can be a possible reason for nocturia 2-3x. Pt still has 3 weeks worth of sample from the 4 weeks worth of samples given initially. She reports of incorporating more water  Patient denies any gross hematuria, dysuria or suprapubic/flank pain.  Patient denies any fevers, chills, nausea or vomiting.   The patient is experiencing urgency x 0-3 (stable), frequency x 4-7 (stable), not restricting fluids to avoid visits to the restroom , not engaging in toilet mapping, incontinence  x 4-7 (stable) and nocturia x 0-3 (stable).   Her BP is 129/80.   Her PVR is 108 mL.    Background History:  She was referred to [name] by Dr. Jacqulyn Cane for recurrent urinary tract infections and nocturia. Patient stated that she has had 2 to 3 urinary tract infections over the last year and this has been occurring for the last several years.  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   She was Positive for E. coli on January 18, 2018 Her symptoms with a urinary tract infection consist of malodorous urine, dysuria and bad bladder spasms during the urinary stream and worsens at the end.   She states she sees a pink tint on the toilet paper once in a great while.  She  denies suprapubic pain, back pain, abdominal pain or flank pain associated with UTI's.   She has not had any recent fevers, chills, nausea or vomiting associated with UTI's.  She has had a remote history of nephrolithiasis for which she spontaneously passed. She does not have a history of GU surgery or GU trauma.  Her sister and father have had stones.   She is sexually active.  She has noted a correlation with her urinary tract infections and sexual intercourse.   She does not engage in anal sex.   She is voiding before and after sex.  She is postmenopausal.  She admits to mild constipation.    She does engage in good perineal hygiene. She does take tub baths three times a week.  She had a baseline frequency x 4, strong urgency, nocturia x 1-3 and SUI and UI (wears a light pad every day).  She has a constant suprapubic pressure.   Patient denies any gross hematuria, dysuria or suprapubic/flank pain.  Patient denies any fevers, chills, nausea or vomiting.   Her UA today is positive for moderate bacteria.   Her PVR is 0 mL.   She drinks water occasionally.  She drinks 32 ounces of half and half tea daily.  She is drinking 16 ounces of lemonade/flavored water in the evening.  She has an occasional diet soda.  She does not drink coffee.  She does not  drink alcohol.  She does not drink energy drinks.      PMH: Past Medical History:  Diagnosis Date  . Abnormal Pap smear of cervix   . BRCA negative 02/04/2012   Sample completed by [facility]. Performing laboratory: [facility]. Assession #:[id number] LD  . Cystitis   . Family history of breast cancer   . Increased risk of breast cancer 2013, 2019   riskscore=28.6% due to [place] (not BRIP1 mutation)  . 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 APPENDECTOMY    . BREAST BIOPSY Right 07/06/2001   Stereotactic biopsy showing  fibrocystic changes, apocrine metaplasia and ductal adenosis, ductal epithelial hyperplasia without atypia.  Marland Kitchen BREAST BIOPSY Right 11/10/2016   benign, dicordant per radiologist  . BREAST CYST ASPIRATION Left 1994  . COLONOSCOPY N/A 04/02/2015   Procedure: COLONOSCOPY;  Surgeon: Manya Silvas, MD;  Location: [facility] ENDOSCOPY;  Service: Endoscopy;  Laterality: N/A;  . TONSILLECTOMY      Home Medications:  Allergies as of 03/19/2018      Reactions   Doxycycline Nausea And Vomiting, Other (See Comments)   dizzy   Phenergan [promethazine Hcl] Nausea And Vomiting   Tramadol Nausea And Vomiting, Other (See Comments)   dizzy   Keflex [cephalexin] Rash   Ketek [telithromycin] Itching, Rash   Levaquin [levofloxacin In D5w] Rash   Nitrofurantoin Monohyd Macro Rash   Other Rash   Muscle relaxer medication but not flexeril or robaxin pt unsure of the name.    Parafon Forte Dsc [chlorzoxazone] Rash      Medication List        Accurate as of 03/19/18  9:12 AM. Always use your most recent med list.          fluticasone 50 MCG/ACT nasal spray Commonly known as:  FLONASE Place 2 sprays into both nostrils daily.   meloxicam 15 MG tablet Commonly known as:  MOBIC Take 15 mg by mouth as needed.   mirabegron ER 25 MG Tb24 tablet Commonly known as:  MYRBETRIQ Take 1 tablet (25 mg total) by mouth daily.       Allergies:  Allergies  Allergen Reactions  . Doxycycline Nausea And Vomiting and Other (See Comments)    dizzy  . Phenergan [Promethazine Hcl] Nausea And Vomiting  . Tramadol Nausea And Vomiting and Other (See Comments)    dizzy  . Keflex [Cephalexin] Rash  . Ketek [Telithromycin] Itching and Rash  . Levaquin [Levofloxacin In D5w] Rash  . Nitrofurantoin Monohyd Macro Rash  . Other Rash    Muscle relaxer medication but not flexeril or robaxin pt unsure of the name.   Dolly Rias Dsc [Chlorzoxazone] Rash    Family History: Family History  Problem Relation Age of  Onset  . Breast cancer Mother 40  . Bone cancer Mother   . Diabetes Mother   . Breast cancer Maternal Aunt   . Ovarian cancer Maternal Aunt   . Heart disease Father   . Thyroid disease Father   . Colon polyps Sister   . Cancer Sister     Social History:  reports that she has never smoked. She has never used smokeless tobacco. She reports that she does not drink alcohol or use drugs.  ROS: UROLOGY Frequent Urination?: No Hard to postpone urination?: No Burning/pain with urination?: No Get up at night to urinate?: Yes Leakage of  urine?: Yes Urine stream starts and stops?: No Trouble starting stream?: No Do you have to strain to urinate?: No Blood in urine?: No Urinary tract infection?: Yes Sexually transmitted disease?: No Injury to kidneys or bladder?: No Painful intercourse?: No Weak stream?: No Currently pregnant?: No Vaginal bleeding?: No Last menstrual period?: n  Gastrointestinal Nausea?: No Vomiting?: No Indigestion/heartburn?: No Diarrhea?: No Constipation?: No  Constitutional Fever: No Night sweats?: No Weight loss?: No Fatigue?: No  Skin Skin rash/lesions?: No Itching?: No  Eyes Blurred vision?: No Double vision?: No  Ears/Nose/Throat Sore throat?: No Sinus problems?: No  Hematologic/Lymphatic Swollen glands?: No Easy bruising?: No  Cardiovascular Leg swelling?: No Chest pain?: No  Respiratory Cough?: No Shortness of breath?: No  Endocrine Excessive thirst?: No  Musculoskeletal Back pain?: No Joint pain?: No  Neurological Headaches?: No Dizziness?: No  Psychologic Depression?: No Anxiety?: No  Physical Exam: BP 129/80 (BP Location: Left Arm, Patient Position: Sitting, Cuff Size: Normal)   Pulse 91   Ht 5' 4" (1.626 m)   Wt 183 lb 14.4 oz (83.4 kg)   BMI 31.57 kg/m   Constitutional:  Well nourished. Alert and oriented, No acute distress. HEENT: [place] AT, moist mucus membranes.  Trachea midline, no  masses. Cardiovascular: No clubbing, cyanosis, or edema. Respiratory: Normal respiratory effort, no increased work of breathing. Skin: No rashes, bruises or suspicious lesions. Neurologic: Grossly intact, no focal deficits, moving all 4 extremities. Psychiatric: Normal mood and affect.   Pertinent Imaging: Results for orders placed or performed in visit on 03/19/18  Bladder Scan (Post Void Residual) in office  Result Value Ref Range   Scan Result 108 ml      Assessment & Plan:   1. Urgency Criteria for recurrent UTI has not been met with 2 or more infections in 6 months or 3 or greater infections in one year  Patient is instructed to increase their water intake until the urine is pale yellow or clear (10 to 12 cups daily) Avoid soaking in tubs and wipe front to back after urinating  Offered medical therapy with anticholinergic therapy or beta-3 adrenergic receptor agonist and the potential side effects of each therapy - would like to try the beta-3 adrenergic receptor agonist (Myrbetriq).  Given Myrbetriq 50 mg samples, #28.  I have reviewed with the patient of the side effects of Myrbetriq, such as: elevation in BP, urinary retention and/or HA.   Finish remaining Myrbetriq 25 mg, then continue with Myrbetriq 50 mg RTC in 1 month for PVR and OAB questionnaire                      2. Nocturia I explained to the patient that nocturia is often multi-factorial and difficult to treat.  Sleeping disorders, heart conditions, peripheral vascular disease, diabetes, an enlarged prostate for men, an urethral stricture causing bladder outlet obstruction and/or certain medications can contribute to nocturia. I have suggested that the patient avoid caffeine after noon and alcohol in the evening.  He or she may also benefit from fluid restrictions after 6:00 in the evening and voiding just prior to bedtime. I have explained that research studies have showed that over 84% of patients with sleep apnea  reported frequent nighttime urination.   With sleep apnea, oxygen decreases, carbon dioxide increases, the blood become more acidic, the heart rate drops and blood vessels in the lung constrict.  The body is then alerted that something is very wrong. The sleeper must wake enough to  reopen the airway. By this time, the heart is racing and experiences a false signal of fluid overload. The heart excretes a hormone-like protein that tells the body to get rid of sodium and water, resulting in nocturia. There is also an increased incidence in sleep apnea with menopause, symptoms include night sweats, daytime sleepiness, depressed mood, and cognitive complaints like poor concentration or problems with short-term memory  The patient may benefit from a discussion with his or her primary care physician to see if he or she has risk factors for sleep apnea or other sleep disturbances and obtaining a sleep study.  3. Cystocele Starting PT in January   Return in about 1 month (around 04/19/2018) for OAB/PVR.  These notes generated with voice recognition software. I apologize for typographical errors.  Zara Council, PA-C  [place] [address]  [place], [place] [address] [phone]  I, Lucas Mallow, am acting as a Education administrator for [employer]  I have reviewed the above documentation for accuracy and completeness, and I agree with the above.    Zara Council, PA-C

## 2018-03-19 ENCOUNTER — Ambulatory Visit: Payer: Managed Care, Other (non HMO) | Admitting: Urology

## 2018-03-19 ENCOUNTER — Encounter: Payer: Self-pay | Admitting: Urology

## 2018-03-19 VITALS — BP 129/80 | HR 91 | Ht 64.0 in | Wt 183.9 lb

## 2018-03-19 DIAGNOSIS — N8111 Cystocele, midline: Secondary | ICD-10-CM | POA: Diagnosis not present

## 2018-03-19 DIAGNOSIS — R351 Nocturia: Secondary | ICD-10-CM

## 2018-03-19 DIAGNOSIS — R3915 Urgency of urination: Secondary | ICD-10-CM | POA: Diagnosis not present

## 2018-03-19 LAB — BLADDER SCAN AMB NON-IMAGING: Scan Result: 108

## 2018-04-14 HISTORY — PX: TOTAL ABDOMINAL HYSTERECTOMY W/ BILATERAL SALPINGOOPHORECTOMY: SHX83

## 2018-04-19 ENCOUNTER — Ambulatory Visit: Payer: Managed Care, Other (non HMO) | Attending: Urology

## 2018-04-19 ENCOUNTER — Other Ambulatory Visit: Payer: Self-pay

## 2018-04-19 DIAGNOSIS — R293 Abnormal posture: Secondary | ICD-10-CM | POA: Insufficient documentation

## 2018-04-19 DIAGNOSIS — M62838 Other muscle spasm: Secondary | ICD-10-CM | POA: Insufficient documentation

## 2018-04-19 DIAGNOSIS — M533 Sacrococcygeal disorders, not elsewhere classified: Secondary | ICD-10-CM | POA: Insufficient documentation

## 2018-04-19 NOTE — Patient Instructions (Signed)
   Hold for 30 seconds (5 deep breaths) and repeat 2-3 times on each side once a day      

## 2018-04-19 NOTE — Therapy (Signed)
[place] MAIN [facility] SERVICES [address] [place], [place], [address] Phone: [phone]   Fax:  [phone]  Physical Therapy Evaluation  Patient Details  Name: Michelle Mejia MRN: [id number] Date of Birth: [birth year] Referring Provider (PT): Zara Council   Encounter Date: 04/19/2018  PT End of Session - 04/21/18 1304    Visit Number  1    Number of Visits  12    Date for PT Re-Evaluation  07/14/18    PT Start Time  0830    PT Stop Time  0930    PT Time Calculation (min)  60 min    Activity Tolerance  Patient tolerated treatment well    Behavior During Therapy  [facility] for tasks assessed/performed       Past Medical History:  Diagnosis Date  . Abnormal Pap smear of cervix   . BRCA negative 02/04/2012   Sample completed by [facility]. Performing laboratory: [facility]. Assession #:[id number] LD  . Cystitis   . Family history of breast cancer   . Increased risk of breast cancer 2013, 2019   riskscore=28.6% due to [place] (not BRIP1 mutation)  . 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 APPENDECTOMY    . BREAST BIOPSY Right 07/06/2001   Stereotactic biopsy showing fibrocystic changes, apocrine metaplasia and ductal adenosis, ductal epithelial hyperplasia without atypia.  Marland Kitchen BREAST BIOPSY Right 11/10/2016   benign, dicordant per radiologist  . BREAST CYST ASPIRATION Left 1994  . COLONOSCOPY N/A 04/02/2015   Procedure: COLONOSCOPY;  Surgeon: Manya Silvas, MD;  Location: [facility] ENDOSCOPY;  Service: Endoscopy;  Laterality: N/A;  . TONSILLECTOMY      There were no vitals filed for this visit.       [facility] PT Assessment - 04/21/18 0001      Assessment   Medical Diagnosis  Urinary Urgency/ Cystocele    Referring Provider (PT)  Zara Council    Onset Date/Surgical Date  04/19/13    Prior Therapy  Myrbetriq prescribed       Precautions   Precautions  None      Restrictions   Weight Bearing Restrictions  No      Balance Screen   Has the patient fallen in the past 6 months  No      [place] residence    Living Arrangements  Spouse/significant other    Available Help at Discharge  Family    Type of [place] to enter    Entrance Stairs-Number of Steps  2    [place]  One level      Prior Function   Level of Independence  Independent    Vocation  Full time employment    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   Leisure  Walking      Cognition   Overall Cognitive Status  Within Functional Limits for tasks assessed        Pelvic Floor Physical Therapy Evaluation and Assessment  SCREENING  Falls in last 6 mo: no    Patient's communication preference:   Red Flags:  Have you had any night sweats? Yes, perimenapausal Unexplained weight loss? no Saddle anesthesia? no Unexplained changes in bowel or bladder habits? no  SUBJECTIVE  Patient reports: She does not often have to run to the bathroom but has leakage when pulling down her pants sometimes, has SUI with cough/sneeze, etc. And occasionally with movement. Gets 1-2 bladder infections per year.  Can only stand for ~20-30 min. Without feeling achy, weakness in the back. has plantar fasciitis and gets severe cramps in lateral portion of R lower leg  Precautions:  High risk of Cervical CA, having a total hysterectomy in ~ 1 month.   Social/Family/Vocational History:   Works 2 jobs, sitting for ~12 hrs 5 days/week.  Recent Procedures/Tests/Findings:  Needs a sleep study for sleep apnea.  Obstetrical History: 1 vaginal delivery, tearing/episiotomy  Gynecological History: Fibroids, positive PAP, positive for cervical CA gene  Urinary History: Has been having leakage for 4-5 years and started using a  panty-liner and has leakage a little with certain motions, more with cough/sneeze, and notices more at night both while asleep and awake. Nocturia 2-3x/night. Urinating ~ 4 times throughout the day.  Drinking ~ 16-32 oz of Water and ~ 24oz of semi-sweet tea in the morning.  Gastrointestinal History: Has a hard time having a BM with mild straining. Grade 1-4 BM's  Sexual activity/pain: Has pain occasionally with intercourse, both initial and deep thrusting  Location of pain: low back and B hip pain as well as lower abdomen Current pain:  0/10  Max pain:  3/10 Least pain:  0/10 Nature of pain: dull achy  Patient Goals: To not have leakage and to be able to decrease nocturia. Not to get bladder infections.   OBJECTIVE  Posture/Observations:  Sitting:  Standing: L foot ER, posterior pelvic tilt, weight over heels, R hip high, R PSIS high.  Palpation/Segmental Motion/Joint Play:  Special tests:   Stork: decreased mobility on R, decreased stability on L  Range of Motion/Flexibilty:  Spine: ~ 4 inches from floor in standing forward bend, R hip high, ~ 25 % reduction in SB B with slight pulling on R during L SB, decreased L rotation by ~ 30%. Hips:   Strength/MMT:  LE MMT  LE MMT Left Right  Hip flex:  (L2) /5 /5  Hip ext: /5 /5  Hip abd: /5 /5  Hip add: /5 /5  Hip IR /5 /5  Hip ER /5 /5     Abdominal:  Palpation: Diastasis:  Pelvic Floor External Exam: Introitus Appears: WNL Skin integrity: erythema Palpation: no TTP Cough: paradoxical Prolapse visible?: no Scar mobility: significant restriction. Mobility: paradoxical motion with breathing and decreased ability to lengthen.  Internal Vaginal Exam: Strength (PERF): 2/5, 5 sec, 1 time Symmetry: symmetrical Palpation: TTP throughout, Posterior>anterior, greatest at B coccygeus. Prolapse: none visualized  Internal Rectal Exam: Deferred until deemed necessary Strength  (PERF): Symmetry: Palpation: Prolapse:   Gait Analysis: deferred to follow-up   Pelvic Floor Outcome Measures: PFDI: 84/300, UIQ: 5/100  INTERVENTIONS THIS SESSION: Self-care: Educated on the structure and function of the pelvic floor in relation to their symptoms as well as the POC, and initial HEP in order to set patient expectations and understanding from which we will build on in the future sessions. Therex: educated on and practiced side-stretch to decrease imbalance of musculature and allow for improved success with decreasing spasms and pelvic alignment and acceptance of heel-lift.  Total time: 60 min.           Objective measurements completed on examination: See above findings.              PT Education -  04/21/18 1303    Education Details  See Pt. Instructions and Interventions this session    Person(s) Educated  Patient    Comprehension  Verbalized understanding       PT Short Term Goals - 04/21/18 1623      PT SHORT TERM GOAL #1   Title  Patient will demonstrate a coordinated contraction, relaxation, and bulge of the pelvic floor muscles to demonstrate functional recruitment and motion and allow for further strengthening.    Baseline  Pt. deomonstrates paradoxical motion of PFM with breathing and coughing and difficulty lengthening the PFM    Time  6    Period  Weeks    Status  New    Target Date  06/02/18      PT SHORT TERM GOAL #2   Title  Patient will demonstrate coordinated diaphragmatic breathing with pelvic tilts to demonstrate improved control of diaphragm and TA, to allow for further strengthening of core musculature and decreased pelvic floor spasm.    Baseline  Demonstrates dysfunctional breathing.    Time  6    Period  Weeks    Status  New    Target Date  06/02/18      PT SHORT TERM GOAL #3   Title  Patient will demonstrate improved pelvic alignment and balance of musculature surrounding the pelvis to facilitate decreased PFM  spasms and decrease pelvic pain.    Baseline  Pt demonstrates leg-length discrepancy     Time  6    Period  Weeks    Status  New    Target Date  06/02/18        PT Long Term Goals - 04/21/18 1804      PT LONG TERM GOAL #1   Title  Patient will report no episodes of SUI over the course of the prior two weeks to demonstrate improved functional ability.    Time  12    Period  Weeks    Status  New    Target Date  07/14/18      PT LONG TERM GOAL #2   Title  Patient will score at or below 39/300 on the PFDI and 0/100 on the UIQ to demonstrate a clinically meaningful decrease in disability and distress due to pelvic floor dysfunction.    Baseline  PFDI: 84/300, UIQ: 5/100    Time  12    Period  Weeks    Status  New    Target Date  07/14/18      PT LONG TERM GOAL #3   Title  Patient will report no pain with intercourse to demonstrate improved functional ability.    Baseline  occasional pain with initial penetration and deep thrusting    Time  12    Period  Weeks    Status  New    Target Date  07/14/18      PT LONG TERM GOAL #4   Title  Pt. will demonstrate decreased nocturia to < or = 1x/night    Baseline  nocturia 2-3x/night    Time  12    Period  Weeks    Status  New    Target Date  07/14/18             Plan - 04/21/18 1323    Clinical Impression Statement  Pt. is a 58 y/o female who presents today with cheif c/o mixed urinary incontinence, constipation, and nocturia as well as pelvic pain. Her relevant PMH includes Fibroids and a positive  Pap smear as well as positive genetic testing for cervical CA gene so she will be undergoing a hysterectomy in ~ 1 month. Her clinical exam revealed breathing dysfunction, a leg-length discrepancy with the R LE longer, and both spasm and weakness of the muscles within and surrounding the Pelvis as well as decreased lumbar and sacral mobility. She will benefit from skilled pelvic PT to address the noted defecits as well as to continue  ro assess for and address the otherpotential causes of her symptoms.    History and Personal Factors relevant to plan of care:  Upcoming Hysterectomy    Clinical Presentation  Evolving    Clinical Decision Making  Moderate    Rehab Potential  Fair    Clinical Impairments Affecting Rehab Potential  Pt. is having a hysterectomy soon and will benefit from treatment but will likely have a set-back following operation and need further treatment.    PT Frequency  1x / week    PT Duration  12 weeks    PT Treatment/Interventions  ADLs/Self Care Home Management;Biofeedback;Moist Heat;Traction;Electrical Stimulation;Gait training;Functional mobility training;Neuromuscular re-education;Balance training;Therapeutic exercise;Therapeutic activities;Patient/family education;Manual techniques;Dry needling;Passive range of motion;Scar mobilization;Energy conservation;Taping;Joint Manipulations;Spinal Manipulations    PT Next Visit Plan  address pelvic alignment and give L heel-lift    PT Home Exercise Plan  side-stretch    Consulted and Agree with Plan of Care  Patient       Patient will benefit from skilled therapeutic intervention in order to improve the following deficits and impairments:  Increased fascial restricitons, Improper body mechanics, Pain, Cardiopulmonary status limiting activity, Decreased coordination, Decreased mobility, Increased muscle spasms, Postural dysfunction, Decreased activity tolerance, Decreased endurance, Decreased range of motion, Decreased strength, Difficulty walking  Visit Diagnosis: Abnormal posture  Sacrococcygeal disorders, not elsewhere classified  Other muscle spasm     Problem List Patient Active Problem List   Diagnosis Date Noted  . Fibroid 02/17/2018  . Abnormal genetic test 02/15/2018  . History of uterine fibroid 02/15/2018  . High risk of ovarian cancer 01/19/2018  . Family history of breast cancer 01/19/2018  . Chest pain with high risk for cardiac  etiology 02/16/2017  . SOB (shortness of breath) on exertion 02/16/2017  . Abnormal mammogram of right breast 11/06/2016  . Joint pain 01/13/2015   Willa Rough DPT, ATC Willa Rough 04/21/2018, 7:07 PM  [place] [facility]IN [facility] SERVICES [address] [place], [place], [address] Phone: [phone]   Fax:  [phone]  Name: Michelle Mejia MRN: [id number] Date of Birth: [birth year]

## 2018-04-23 ENCOUNTER — Ambulatory Visit: Payer: Managed Care, Other (non HMO) | Admitting: Urology

## 2018-04-26 ENCOUNTER — Ambulatory Visit: Payer: Managed Care, Other (non HMO)

## 2018-04-26 DIAGNOSIS — R293 Abnormal posture: Secondary | ICD-10-CM

## 2018-04-26 DIAGNOSIS — M62838 Other muscle spasm: Secondary | ICD-10-CM

## 2018-04-26 DIAGNOSIS — M533 Sacrococcygeal disorders, not elsewhere classified: Secondary | ICD-10-CM

## 2018-04-26 NOTE — Therapy (Signed)
[place] MAIN [facility] SERVICES [address] [place], [place], [address] Phone: [phone]   Fax:  [phone]  Physical Therapy Treatment  Patient Details  Name: Michelle Mejia MRN: [id number] Date of Birth: [birth year] Referring Provider (PT): Zara Council   Encounter Date: 04/26/2018  PT End of Session - 04/26/18 1023    Visit Number  2    Number of Visits  12    Date for PT Re-Evaluation  07/14/18    PT Start Time  0834    PT Stop Time  0934    PT Time Calculation (min)  60 min    Activity Tolerance  Patient tolerated treatment well    Behavior During Therapy  [facility] for tasks assessed/performed       Past Medical History:  Diagnosis Date  . Abnormal Pap smear of cervix   . BRCA negative 02/04/2012   Sample completed by [facility]. Performing laboratory: [facility]. Assession #:[id number] LD  . Cystitis   . Family history of breast cancer   . Increased risk of breast cancer 2013, 2019   riskscore=28.6% due to [place] (not BRIP1 mutation)  . 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 APPENDECTOMY    . BREAST BIOPSY Right 07/06/2001   Stereotactic biopsy showing fibrocystic changes, apocrine metaplasia and ductal adenosis, ductal epithelial hyperplasia without atypia.  Marland Kitchen BREAST BIOPSY Right 11/10/2016   benign, dicordant per radiologist  . BREAST CYST ASPIRATION Left 1994  . COLONOSCOPY N/A 04/02/2015   Procedure: COLONOSCOPY;  Surgeon: Manya Silvas, MD;  Location: [facility] ENDOSCOPY;  Service: Endoscopy;  Laterality: N/A;  . TONSILLECTOMY      There were no vitals filed for this visit.    Pelvic Floor Physical Therapy Treatment Note  SCREENING  Changes in medications, allergies, or medical history?: no    SUBJECTIVE  Patient reports: She has been doing her stretches most days, had some pain just once, no  change in symptoms yet.  Precautions:  High risk of Cervical CA, having a total hysterectomy in ~ 1 month.   Pain update:  Location of pain: low back and B hip pain as well as lower abdomen Current pain: 0/10  Max pain: 3/10 Least pain: 0/10 Nature of pain:dull achy  Patient Goals: To not have leakage and to be able to decrease nocturia. Not to get bladder infections.   OBJECTIVE  Changes in: Posture/Observations:  R PSIS high and R LE long in sitting and standing.   Following treatment, Pt. Demonstrates level PSIS in standing with minimal compensation through R knee bend still present.   Palpation: TTP through B Iliacus and through R QL and lumbar paraspinals.  Gait Analysis: Posterior pelvic tilt, straight knee at heel-strike, decreased thoracic twist with AMB.  INTERVENTIONS THIS SESSION: Manual: Performed TP release and STM to B Iliacus and through R QL and lumbar paraspinals to decrease pain and spasm and allow for improved success with up-slip correction to allow for heel-lift acceptance and decreased pain and spasm in the LB and pelvic floor. Performed PA mobs through R sacral border and R up-slip correction to improve pelvic alignment and allow for acceptance of heel-lift to allow PFM relaxation. Dry-needle: Performed TPDN with a .30x14m and .30x737mneedle to to B Iliacus and through R QL and lumbar paraspinals to decrease pain and spasm and allow for improved success with  up-slip correction to allow for heel-lift acceptance and decreased pain and spasm in the LB and pelvic floor. Therex: Educated on and practiced Piriformis and hip-flexor stretches  NM re-ed: educated on and practiced standing and walking posture to improve balanced recruitment of core musculature and allow for decreased spasm of muscles surrounding the pelvis to allow for decreased pressure on lumbosacral nerve roots and improved PFM function.  Total time: 60  min.                    Trigger Point Dry Needling - 04/26/18 1023    Consent Given?  Yes    Education Handout Provided  No    Muscles Treated Upper Body  Quadratus Lumborum;Longissimus;Iliopsoas    Longissimus Response  Twitch response elicited;Palpable increased muscle length           PT Education - 04/26/18 1023    Education Details  See Pt. Instructions and Interventions this session.    Person(s) Educated  Patient    Methods  Explanation;Demonstration;Verbal cues;Handout    Comprehension  Verbalized understanding;Returned demonstration;Verbal cues required       PT Short Term Goals - 04/21/18 1623      PT SHORT TERM GOAL #1   Title  Patient will demonstrate a coordinated contraction, relaxation, and bulge of the pelvic floor muscles to demonstrate functional recruitment and motion and allow for further strengthening.    Baseline  Pt. deomonstrates paradoxical motion of PFM with breathing and coughing and difficulty lengthening the PFM    Time  6    Period  Weeks    Status  New    Target Date  06/02/18      PT SHORT TERM GOAL #2   Title  Patient will demonstrate coordinated diaphragmatic breathing with pelvic tilts to demonstrate improved control of diaphragm and TA, to allow for further strengthening of core musculature and decreased pelvic floor spasm.    Baseline  Demonstrates dysfunctional breathing.    Time  6    Period  Weeks    Status  New    Target Date  06/02/18      PT SHORT TERM GOAL #3   Title  Patient will demonstrate improved pelvic alignment and balance of musculature surrounding the pelvis to facilitate decreased PFM spasms and decrease pelvic pain.    Baseline  Pt demonstrates leg-length discrepancy     Time  6    Period  Weeks    Status  New    Target Date  06/02/18        PT Long Term Goals - 04/21/18 1804      PT LONG TERM GOAL #1   Title  Patient will report no episodes of SUI over the course of the prior two weeks to  demonstrate improved functional ability.    Time  12    Period  Weeks    Status  New    Target Date  07/14/18      PT LONG TERM GOAL #2   Title  Patient will score at or below 39/300 on the PFDI and 0/100 on the UIQ to demonstrate a clinically meaningful decrease in disability and distress due to pelvic floor dysfunction.    Baseline  PFDI: 84/300, UIQ: 5/100    Time  12    Period  Weeks    Status  New    Target Date  07/14/18      PT LONG TERM GOAL #3   Title  Patient will report no pain with intercourse to demonstrate improved functional ability.    Baseline  occasional pain with initial penetration and deep thrusting    Time  12    Period  Weeks    Status  New    Target Date  07/14/18      PT LONG TERM GOAL #4   Title  Pt. will demonstrate decreased nocturia to < or = 1x/night    Baseline  nocturia 2-3x/night    Time  12    Period  Weeks    Status  New    Target Date  07/14/18            Plan - 04/26/18 1145    Clinical Impression Statement  Pt. responded well to all interventions today, demonstrating decreased tenderness and spasm around B hips as well as improved mobility and pelvic alignment with addition on heel-lift in L shoe. She demonstrated correct performance and understanding of all education and exercises provided. Continue per POC.     Clinical Presentation  Evolving    Clinical Decision Making  Moderate    Rehab Potential  Fair    Clinical Impairments Affecting Rehab Potential  Pt. is having a hysterectomy soon and will benefit from treatment but will likely have a set-back following operation and need further treatment.    PT Frequency  1x / week    PT Duration  12 weeks    PT Treatment/Interventions  ADLs/Self Care Home Management;Biofeedback;Moist Heat;Traction;Electrical Stimulation;Gait training;Functional mobility training;Neuromuscular re-education;Balance training;Therapeutic exercise;Therapeutic activities;Patient/family education;Manual  techniques;Dry needling;Passive range of motion;Scar mobilization;Energy conservation;Taping;Joint Manipulations;Spinal Manipulations    PT Next Visit Plan  Internal TP release with focus on deep posterior muscles B. give breathing and pelvic tilts/coordination with gentle kegels/lengthening for coordination and healing post-op.    PT Home Exercise Plan  side-stretch, hip-flexor stretch, piriformis stretch.    Consulted and Agree with Plan of Care  Patient       Patient will benefit from skilled therapeutic intervention in order to improve the following deficits and impairments:  Increased fascial restricitons, Improper body mechanics, Pain, Cardiopulmonary status limiting activity, Decreased coordination, Decreased mobility, Increased muscle spasms, Postural dysfunction, Decreased activity tolerance, Decreased endurance, Decreased range of motion, Decreased strength, Difficulty walking  Visit Diagnosis: Abnormal posture  Sacrococcygeal disorders, not elsewhere classified  Other muscle spasm     Problem List Patient Active Problem List   Diagnosis Date Noted  . Fibroid 02/17/2018  . Abnormal genetic test 02/15/2018  . History of uterine fibroid 02/15/2018  . High risk of ovarian cancer 01/19/2018  . Family history of breast cancer 01/19/2018  . Chest pain with high risk for cardiac etiology 02/16/2017  . SOB (shortness of breath) on exertion 02/16/2017  . Abnormal mammogram of right breast 11/06/2016  . Joint pain 01/13/2015   Willa Rough DPT, ATC Willa Rough 04/26/2018, 11:51 AM  [place] MAIN [facility] SERVICES [address] [place], [place], [address] Phone: [phone]   Fax:  [phone]  Name: Michelle Mejia MRN: [id number] Date of Birth: 1961/03/31

## 2018-04-26 NOTE — Patient Instructions (Signed)
Pigeon pose for Piriformis stretch     Sit up with a tall spine and cross one leg over the other knee. Hinge from the hip and lean until you can feel a stretch through your bottom hold for 5 deep breaths and then switch sides. Repeat 2-3 times on each side.    Flexors, Lunge  Hip Flexor Stretch: Proposal Pose    Maintain pelvic tuck under, lift pubic bone toward navel. Engage posterior hip muscles (firm glute muscles of leg in back position) and shift forward until you feel stretch on front of leg that is down. To increase stretch, maintain balance and ease hips forward. You may use one hand on a chair for balance if needed. Hold for __5__ breaths. Repeat __2-3__ times each leg.  Do _1-2__ times per day.  * If you start for get really sore in the low back/hip back off your wear time with the heel-lift a little bit and then progress when you can go a day without an increase in pain of more than 1-2 points.

## 2018-04-29 ENCOUNTER — Ambulatory Visit (INDEPENDENT_AMBULATORY_CARE_PROVIDER_SITE_OTHER): Payer: Managed Care, Other (non HMO) | Admitting: Obstetrics & Gynecology

## 2018-04-29 ENCOUNTER — Encounter: Payer: Self-pay | Admitting: Obstetrics & Gynecology

## 2018-04-29 VITALS — BP 120/80 | Ht 64.0 in | Wt 185.0 lb

## 2018-04-29 DIAGNOSIS — Z803 Family history of malignant neoplasm of breast: Secondary | ICD-10-CM

## 2018-04-29 DIAGNOSIS — D219 Benign neoplasm of connective and other soft tissue, unspecified: Secondary | ICD-10-CM

## 2018-04-29 DIAGNOSIS — Z9189 Other specified personal risk factors, not elsewhere classified: Secondary | ICD-10-CM | POA: Diagnosis not present

## 2018-04-29 DIAGNOSIS — R898 Other abnormal findings in specimens from other organs, systems and tissues: Secondary | ICD-10-CM

## 2018-04-29 NOTE — Progress Notes (Signed)
PRE-OPERATIVE HISTORY AND PHYSICAL EXAM  HPI:  Michelle Mejia is a 58 y.o. G1P1001 No LMP recorded. Patient is perimenopausal.; she is being admitted for surgery related to fibroids and also genetic predisposition to ovarian cancer. 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   Pt has been menopausal for 2 years, min menopausal sx's.  She has had h/o fibroids in past.  Recent US: Ultrasound demonstrates 3 fibroids largest measuring 5.5cm.  She has h/o laparoscopy for ruptured appendix.  PMHx: Past Medical History:  Diagnosis Date  . Abnormal Pap smear of cervix   . BRCA negative 02/04/2012   Sample completed by [facility]. Performing laboratory: [facility]. Assession #:[id number] LD  . Cystitis   . Family history of breast cancer   . Increased risk of breast cancer 2013, 2019   riskscore=28.6% due to [place] (not BRIP1 mutation)  . 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 APPENDECTOMY    . BREAST BIOPSY Right 07/06/2001   Stereotactic biopsy showing fibrocystic changes, apocrine metaplasia and ductal adenosis, ductal epithelial hyperplasia without atypia.  Marland Kitchen BREAST BIOPSY Right 11/10/2016   benign, dicordant per radiologist  . BREAST CYST ASPIRATION Left 1994  . COLONOSCOPY N/A 04/02/2015   Procedure: COLONOSCOPY;  Surgeon: Manya Silvas, MD;  Location[facility] ENDOSCOPY;  Service: Endoscopy;  Laterality: N/A;  . TONSILLECTOMY     Family History  Problem Relation Age of Onset  . Breast cancer Mother 32  . Bone cancer Mother   . Diabetes Mother   . Breast cancer Maternal Aunt   . Ovarian cancer Maternal Aunt   . Heart disease Father   . Thyroid disease Father   . Colon polyps Sister   . Cancer Sister    Social History   Tobacco Use  . Smoking status: Never Smoker  . Smokeless tobacco: Never Used  Substance Use  Topics  . Alcohol use: No    Alcohol/week: 0.0 standard drinks  . Drug use: No    Current Outpatient Medications:  .  ibuprofen (ADVIL,MOTRIN) 200 MG tablet, Take 600 mg by mouth every 6 (six) hours as needed for headache or moderate pain., Disp: , Rfl:  .  meloxicam (MOBIC) 15 MG tablet, Take 15 mg by mouth daily as needed for pain. , Disp: , Rfl:  .  Multiple Vitamins-Calcium (ONE-A-DAY WOMENS PO), Take 1 tablet by mouth daily., Disp: , Rfl:  .  naproxen sodium (ALEVE) 220 MG tablet, Take 220 mg by mouth daily as needed (for pain or headache)., Disp: , Rfl:  Allergies: Doxycycline; Phenergan [promethazine hcl]; Tramadol; Keflex [cephalexin]; Ketek [telithromycin]; Levaquin [levofloxacin in d5w]; Nitrofurantoin monohyd macro; Other; and Parafon forte dsc [chlorzoxazone]  Review of Systems  Constitutional: Negative for chills, fever and malaise/fatigue.  HENT: Negative for congestion, sinus pain and sore throat.   Eyes: Negative for blurred vision and pain.  Respiratory: Negative for cough and wheezing.   Cardiovascular: Negative for chest pain and leg swelling.  Gastrointestinal: Negative for abdominal pain, constipation, diarrhea, heartburn, nausea and vomiting.  Genitourinary: Negative for dysuria, frequency, hematuria and urgency.  Musculoskeletal: Negative for back pain, joint pain, myalgias and neck pain.  Skin: Negative for itching and rash.  Neurological: Negative for dizziness, tremors and weakness.  Endo/Heme/Allergies: Does not bruise/bleed easily.  Psychiatric/Behavioral: Negative for depression. The patient is not nervous/anxious and does  not have insomnia.    Objective: BP 120/80   Ht _0  (1.626 m)   Wt 185 lb (83.9 kg)   BMI 31.76 kg/m   Filed Weights   04/29/18 0917  Weight: 185 lb (83.9 kg)   Physical Exam Constitutional:      General: She is not in acute distress.    Appearance: She is well-developed.  HENT:     Head: Normocephalic and atraumatic. No  laceration.     Right Ear: Hearing normal.     Left Ear: Hearing normal.     Mouth/Throat:     Pharynx: Uvula midline.  Eyes:     Pupils: Pupils are equal, round, and reactive to light.  Neck:     Musculoskeletal: Normal range of motion and neck supple.     Thyroid: No thyromegaly.  Cardiovascular:     Rate and Rhythm: Normal rate and regular rhythm.     Heart sounds: No murmur. No friction rub. No gallop.   Pulmonary:     Effort: Pulmonary effort is normal. No respiratory distress.     Breath sounds: Normal breath sounds. No wheezing.  Chest:     Breasts:        Right: No mass, skin change or tenderness.        Left: No mass, skin change or tenderness.  Abdominal:     General: Bowel sounds are normal. There is no distension.     Palpations: Abdomen is soft.     Tenderness: There is no abdominal tenderness. There is no rebound.  Musculoskeletal: Normal range of motion.  Neurological:     Mental Status: She is alert and oriented to person, place, and time.     Cranial Nerves: No cranial nerve deficit.  Skin:    General: Skin is warm and dry.  Psychiatric:        Judgment: Judgment normal.  Vitals signs reviewed.   Assessment: 1. Abnormal genetic test   2. High risk of ovarian cancer   3. Fibroid   4. Family history of breast cancer   Plan TLH, BSO, Cystoscopy to address fibroid symptoms and potential for future symptoms, and to address ovarian concerns.  I have had a careful discussion with this patient about all the options available and the risk/benefits of each. I have fully informed this patient that surgery may subject her to a variety of discomforts and risks: She understands that most patients have surgery with little difficulty, but problems can happen ranging from minor to fatal. These include nausea, vomiting, pain, bleeding, infection, poor healing, hernia, or formation of adhesions. Unexpected reactions may occur from any drug or anesthetic given. Unintended injury  may occur to other pelvic or abdominal structures such as Fallopian tubes, ovaries, bladder, ureter (tube from kidney to bladder), or bowel. Nerves going from the pelvis to the legs may be injured. Any such injury may require immediate or later additional surgery to correct the problem. Excessive blood loss requiring transfusion is very unlikely but possible. Dangerous blood clots may form in the legs or lungs. Physical and sexual activity will be restricted in varying degrees for an indeterminate period of time but most often 2-6 weeks.  Finally, she understands that it is impossible to list every possible undesirable effect and that the condition for which surgery is done is not always cured or significantly improved, and in rare cases may be even worse.Ample time was given to answer all questions.  Barnett Applebaum, MD, [facility],  [facility] 04/29/2018  9:38 AM

## 2018-04-29 NOTE — Patient Instructions (Signed)
Total Laparoscopic Hysterectomy, Care After °This sheet gives you information about how to care for yourself after your procedure. Your health care provider may also give you more specific instructions. If you have problems or questions, contact your health care provider. °What can I expect after the procedure? °After the procedure, it is common to have: °· Pain and bruising around your incisions. °· A sore throat, if a breathing tube was used during surgery. °· Fatigue. °· Poor appetite. °· Less interest in sex. °If your ovaries were also removed, it is also common to have symptoms of menopause such as hot flashes, night sweats, and lack of sleep (insomnia). °Follow these instructions at home: °Bathing °· Do not take baths, swim, or use a hot tub until your health care provider approves. You may need to only take showers for 2-3 weeks. °· Keep your bandage (dressing) dry until your health care provider says it can be removed. °Incision care ° °· Follow instructions from your health care provider about how to take care of your incisions. Make sure you: °? Wash your hands with soap and water before you change your dressing. If soap and water are not available, use hand sanitizer. °? Change your dressing as told by your health care provider. °? Leave stitches (sutures), skin glue, or adhesive strips in place. These skin closures may need to stay in place for 2 weeks or longer. If adhesive strip edges start to loosen and curl up, you may trim the loose edges. Do not remove adhesive strips completely unless your health care provider tells you to do that. °· Check your incision area every day for signs of infection. Check for: °? Redness, swelling, or pain. °? Fluid or blood. °? Warmth. °? Pus or a bad smell. °Activity °· Get plenty of rest and sleep. °· Do not lift anything that is heavier than 10 lbs (4.5 kg) for one month after surgery, or as long as told by your health care provider. °· Do not drive or use heavy  machinery while taking prescription pain medicine. °· Do not drive for 24 hours if you were given a medicine to help you relax (sedative). °· Return to your normal activities as told by your health care provider. Ask your health care provider what activities are safe for you. °Lifestyle ° °· Do not use any products that contain nicotine or tobacco, such as cigarettes and e-cigarettes. These can delay healing. If you need help quitting, ask your health care provider. °· Do not drink alcohol until your health care provider approves. °General instructions °· Do not douche, use tampons, or have sex for at least 6 weeks, or as told by your health care provider. °· Take over-the-counter and prescription medicines only as told by your health care provider. °· To monitor yourself for a fever, take your temperature at least once a day during recovery. °· If you struggle with physical or emotional changes after your procedure, speak with your health care provider or a therapist. °· To prevent or treat constipation while you are taking prescription pain medicine, your health care provider may recommend that you: °? Drink enough fluid to keep your urine clear or pale yellow. °? Take over-the-counter or prescription medicines. °? Eat foods that are high in fiber, such as fresh fruits and vegetables, whole grains, and beans. °? Limit foods that are high in fat and processed sugars, such as fried and sweet foods. °· Keep all follow-up visits as told by your health care provider.   This is important. °Contact a health care provider if: °· You have chills or a fever. °· You have redness, swelling, or pain around an incision. °· You have fluid or blood coming from an incision. °· Your incision feels warm to the touch. °· You have pus or a bad smell coming from an incision. °· An incision breaks open. °· You feel dizzy or light-headed. °· You have pain or bleeding when you urinate. °· You have diarrhea, nausea, or vomiting that does not  go away. °· You have abnormal vaginal discharge. °· You have a rash. °· You have pain that does not get better with medicine. °Get help right away if: °· You have a fever and your symptoms suddenly get worse. °· You have severe abdominal pain. °· You have chest pain. °· You have shortness of breath. °· You faint. °· You have pain, swelling, or redness on your leg. °· You have heavy vaginal bleeding with blood clots. °Summary °· After the procedure it is common to have abdominal pain. Your provider will give you medication for this. °· Do not take baths, swim, or use a hot tub until your health care provider approves. °· Do not lift anything that is heavier than 10 lbs (4.5 kg) for one month after surgery, or as long as told by your health care provider. °· Notify your provider if you have any signs or symptoms of infection after the procedure. °This information is not intended to replace advice given to you by your health care provider. Make sure you discuss any questions you have with your health care provider. °Document Released: 01/19/2013 Document Revised: 06/11/2016 Document Reviewed: 06/11/2016 °Elsevier Interactive Patient Education © 2019 [employer] ° °

## 2018-04-29 NOTE — H&P (View-Only) (Signed)
PRE-OPERATIVE HISTORY AND PHYSICAL EXAM  HPI:  Michelle Mejia is a 58 y.o. G1P1001 No LMP recorded. Patient is perimenopausal.; she is being admitted for surgery related to fibroids and also genetic predisposition to ovarian cancer. 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   Pt has been menopausal for 2 years, min menopausal sx's.  She has had h/o fibroids in past.  Recent US: Ultrasound demonstrates 3 fibroids largest measuring 5.5cm.  She has h/o laparoscopy for ruptured appendix.  PMHx: Past Medical History:  Diagnosis Date  . Abnormal Pap smear of cervix   . BRCA negative 02/04/2012   Sample completed by [facility]. Performing laboratory: [facility]. Assession #:[id number] LD  . Cystitis   . Family history of breast cancer   . Increased risk of breast cancer 2013, 2019   riskscore=28.6% due to [place] (not BRIP1 mutation)  . 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 APPENDECTOMY    . BREAST BIOPSY Right 07/06/2001   Stereotactic biopsy showing fibrocystic changes, apocrine metaplasia and ductal adenosis, ductal epithelial hyperplasia without atypia.  Marland Kitchen BREAST BIOPSY Right 11/10/2016   benign, dicordant per radiologist  . BREAST CYST ASPIRATION Left 1994  . COLONOSCOPY N/A 04/02/2015   Procedure: COLONOSCOPY;  Surgeon: Manya Silvas, MD;  Location[facility] ENDOSCOPY;  Service: Endoscopy;  Laterality: N/A;  . TONSILLECTOMY     Family History  Problem Relation Age of Onset  . Breast cancer Mother 32  . Bone cancer Mother   . Diabetes Mother   . Breast cancer Maternal Aunt   . Ovarian cancer Maternal Aunt   . Heart disease Father   . Thyroid disease Father   . Colon polyps Sister   . Cancer Sister    Social History   Tobacco Use  . Smoking status: Never Smoker  . Smokeless tobacco: Never Used  Substance Use  Topics  . Alcohol use: No    Alcohol/week: 0.0 standard drinks  . Drug use: No    Current Outpatient Medications:  .  ibuprofen (ADVIL,MOTRIN) 200 MG tablet, Take 600 mg by mouth every 6 (six) hours as needed for headache or moderate pain., Disp: , Rfl:  .  meloxicam (MOBIC) 15 MG tablet, Take 15 mg by mouth daily as needed for pain. , Disp: , Rfl:  .  Multiple Vitamins-Calcium (ONE-A-DAY WOMENS PO), Take 1 tablet by mouth daily., Disp: , Rfl:  .  naproxen sodium (ALEVE) 220 MG tablet, Take 220 mg by mouth daily as needed (for pain or headache)., Disp: , Rfl:  Allergies: Doxycycline; Phenergan [promethazine hcl]; Tramadol; Keflex [cephalexin]; Ketek [telithromycin]; Levaquin [levofloxacin in d5w]; Nitrofurantoin monohyd macro; Other; and Parafon forte dsc [chlorzoxazone]  Review of Systems  Constitutional: Negative for chills, fever and malaise/fatigue.  HENT: Negative for congestion, sinus pain and sore throat.   Eyes: Negative for blurred vision and pain.  Respiratory: Negative for cough and wheezing.   Cardiovascular: Negative for chest pain and leg swelling.  Gastrointestinal: Negative for abdominal pain, constipation, diarrhea, heartburn, nausea and vomiting.  Genitourinary: Negative for dysuria, frequency, hematuria and urgency.  Musculoskeletal: Negative for back pain, joint pain, myalgias and neck pain.  Skin: Negative for itching and rash.  Neurological: Negative for dizziness, tremors and weakness.  Endo/Heme/Allergies: Does not bruise/bleed easily.  Psychiatric/Behavioral: Negative for depression. The patient is not nervous/anxious and does  not have insomnia.    Objective: BP 120/80   Ht _0  (1.626 m)   Wt 185 lb (83.9 kg)   BMI 31.76 kg/m   Filed Weights   04/29/18 0917  Weight: 185 lb (83.9 kg)   Physical Exam Constitutional:      General: She is not in acute distress.    Appearance: She is well-developed.  HENT:     Head: Normocephalic and atraumatic. No  laceration.     Right Ear: Hearing normal.     Left Ear: Hearing normal.     Mouth/Throat:     Pharynx: Uvula midline.  Eyes:     Pupils: Pupils are equal, round, and reactive to light.  Neck:     Musculoskeletal: Normal range of motion and neck supple.     Thyroid: No thyromegaly.  Cardiovascular:     Rate and Rhythm: Normal rate and regular rhythm.     Heart sounds: No murmur. No friction rub. No gallop.   Pulmonary:     Effort: Pulmonary effort is normal. No respiratory distress.     Breath sounds: Normal breath sounds. No wheezing.  Chest:     Breasts:        Right: No mass, skin change or tenderness.        Left: No mass, skin change or tenderness.  Abdominal:     General: Bowel sounds are normal. There is no distension.     Palpations: Abdomen is soft.     Tenderness: There is no abdominal tenderness. There is no rebound.  Musculoskeletal: Normal range of motion.  Neurological:     Mental Status: She is alert and oriented to person, place, and time.     Cranial Nerves: No cranial nerve deficit.  Skin:    General: Skin is warm and dry.  Psychiatric:        Judgment: Judgment normal.  Vitals signs reviewed.   Assessment: 1. Abnormal genetic test   2. High risk of ovarian cancer   3. Fibroid   4. Family history of breast cancer   Plan TLH, BSO, Cystoscopy to address fibroid symptoms and potential for future symptoms, and to address ovarian concerns.  I have had a careful discussion with this patient about all the options available and the risk/benefits of each. I have fully informed this patient that surgery may subject her to a variety of discomforts and risks: She understands that most patients have surgery with little difficulty, but problems can happen ranging from minor to fatal. These include nausea, vomiting, pain, bleeding, infection, poor healing, hernia, or formation of adhesions. Unexpected reactions may occur from any drug or anesthetic given. Unintended injury  may occur to other pelvic or abdominal structures such as Fallopian tubes, ovaries, bladder, ureter (tube from kidney to bladder), or bowel. Nerves going from the pelvis to the legs may be injured. Any such injury may require immediate or later additional surgery to correct the problem. Excessive blood loss requiring transfusion is very unlikely but possible. Dangerous blood clots may form in the legs or lungs. Physical and sexual activity will be restricted in varying degrees for an indeterminate period of time but most often 2-6 weeks.  Finally, she understands that it is impossible to list every possible undesirable effect and that the condition for which surgery is done is not always cured or significantly improved, and in rare cases may be even worse.Ample time was given to answer all questions.  Barnett Applebaum, MD, [facility],  [facility] 04/29/2018  9:38 AM

## 2018-04-30 ENCOUNTER — Encounter: Payer: Managed Care, Other (non HMO) | Admitting: Obstetrics & Gynecology

## 2018-05-03 ENCOUNTER — Other Ambulatory Visit: Payer: Self-pay

## 2018-05-03 ENCOUNTER — Encounter
Admission: RE | Admit: 2018-05-03 | Discharge: 2018-05-03 | Disposition: A | Discharge status: Home / Self Care | Payer: Managed Care, Other (non HMO) | Source: Ambulatory Visit | Attending: Obstetrics & Gynecology | Admitting: Obstetrics & Gynecology

## 2018-05-03 HISTORY — DX: Personal history of urinary calculi: Z87.442

## 2018-05-03 HISTORY — DX: Family history of other specified conditions: Z84.89

## 2018-05-03 HISTORY — DX: Unspecified osteoarthritis, unspecified site: M19.90

## 2018-05-03 HISTORY — DX: Headache, unspecified: R51.9

## 2018-05-03 HISTORY — DX: Gastro-esophageal reflux disease without esophagitis: K21.9

## 2018-05-03 HISTORY — DX: Headache: R51

## 2018-05-03 NOTE — Patient Instructions (Signed)
Your procedure is scheduled on: 05-06-18 THURSDAY Report to Same Day Surgery 2nd floor medical mall [facility] Entrance-take elevator on left to 2nd floor.  Check in with surgery information desk.) To find out your arrival time please call [phone] between 1PM - 3PM on 05-05-18 [facility]  Remember: Instructions that are not followed completely may result in serious medical risk, up to and including death, or upon the discretion of your surgeon and anesthesiologist your surgery may need to be rescheduled.    _x___ 1. Do not eat food after midnight the night before your procedure. NO GUM OR CANDY AFTER MIDNIGHT.  You may drink clear liquids up to 2 hours before you are scheduled to arrive at the hospital for your procedure.  Do not drink clear liquids within 2 hours of your scheduled arrival to the hospital.  Clear liquids include  --Water or Apple juice without pulp  --Clear carbohydrate beverage such as ClearFast or Gatorade  --Black Coffee or Clear Tea (No milk, no creamers, do not add anything to the coffee or Tea   ____Ensure clear carbohydrate drink on the way to the hospital for bariatric patients  _X___Ensure clear carbohydrate drink 3 hours before surgery    __x__ 2. No Alcohol for 24 hours before or after surgery.   __x__3. No Smoking or e-cigarettes for 24 prior to surgery.  Do not use any chewable tobacco products for at least 6 hour prior to surgery   ____  4. Bring all medications with you on the day of surgery if instructed.    __x__ 5. Notify your doctor if there is any change in your medical condition     (cold, fever, infections).    x___6. On the morning of surgery brush your teeth with toothpaste and water.  You may rinse your mouth with mouth wash if you wish.  Do not swallow any toothpaste or mouthwash.   Do not wear jewelry, make-up, hairpins, clips or nail polish.  Do not wear lotions, powders, or perfumes. You may wear deodorant.  Do not shave 48 hours  prior to surgery. Men may shave face and neck.  Do not bring valuables to the hospital.    [facility] is not responsible for any belongings or valuables.               Contacts, dentures or bridgework may not be worn into surgery.  Leave your suitcase in the car. After surgery it may be brought to your room.  For patients admitted to the hospital, discharge time is determined by your  treatment team.  _  Patients discharged the day of surgery will not be allowed to drive home.  You will need someone to drive you home and stay with you the night of your procedure.    Please read over the following fact sheets that you were given:   [facility] Pre[facility] for [place]  ____ Take anti-hypertensive listed below, cardiac, seizure, asthma, anti-reflux and psychiatric medicines. These include:  1. NONE  2.  3.  4.  5.  6.  ____Fleets enema or Magnesium Citrate as directed.   _x___ Use CHG Soap or sage wipes as directed on instruction sheet   ____ Use inhalers on the day of surgery and bring to hospital day of surgery  ____ Stop Metformin and Janumet 2 days prior to surgery.    ____ Take 1/2 of usual insulin dose the night before surgery and none on the morning surgery.  ____ Follow recommendations from Cardiologist, Pulmonologist or PCP regarding stopping Aspirin, Coumadin, Plavix ,Eliquis, Effient, or Pradaxa, and Pletal.  X____Stop Anti-inflammatories such as Advil, Aleve, Ibuprofen, Motrin, Naproxen, Naprosyn, Goodies powders or aspirin products NOW-OK to take Tylenol    ____ Stop supplements until after surgery.   ____ Bring C-Pap to the hospital.

## 2018-05-04 ENCOUNTER — Encounter
Admission: RE | Admit: 2018-05-04 | Discharge: 2018-05-04 | Disposition: A | Discharge status: Home / Self Care | Payer: Managed Care, Other (non HMO) | Source: Ambulatory Visit | Attending: Obstetrics & Gynecology | Admitting: Obstetrics & Gynecology

## 2018-05-04 ENCOUNTER — Ambulatory Visit: Payer: Managed Care, Other (non HMO)

## 2018-05-04 DIAGNOSIS — Z4002 Encounter for prophylactic removal of ovary: Secondary | ICD-10-CM | POA: Diagnosis not present

## 2018-05-04 DIAGNOSIS — D252 Subserosal leiomyoma of uterus: Secondary | ICD-10-CM | POA: Diagnosis not present

## 2018-05-04 DIAGNOSIS — M533 Sacrococcygeal disorders, not elsewhere classified: Secondary | ICD-10-CM

## 2018-05-04 DIAGNOSIS — D259 Leiomyoma of uterus, unspecified: Secondary | ICD-10-CM | POA: Diagnosis present

## 2018-05-04 DIAGNOSIS — Z01818 Encounter for other preprocedural examination: Secondary | ICD-10-CM | POA: Insufficient documentation

## 2018-05-04 DIAGNOSIS — Z1502 Genetic susceptibility to malignant neoplasm of ovary: Secondary | ICD-10-CM | POA: Diagnosis not present

## 2018-05-04 DIAGNOSIS — Z8041 Family history of malignant neoplasm of ovary: Secondary | ICD-10-CM | POA: Diagnosis not present

## 2018-05-04 DIAGNOSIS — Z803 Family history of malignant neoplasm of breast: Secondary | ICD-10-CM | POA: Diagnosis not present

## 2018-05-04 DIAGNOSIS — R293 Abnormal posture: Secondary | ICD-10-CM

## 2018-05-04 DIAGNOSIS — M62838 Other muscle spasm: Secondary | ICD-10-CM

## 2018-05-04 DIAGNOSIS — D251 Intramural leiomyoma of uterus: Secondary | ICD-10-CM | POA: Diagnosis not present

## 2018-05-04 LAB — PROTIME-INR
INR: 1.05
Prothrombin Time: 13.6 seconds (ref 11.4–15.2)

## 2018-05-04 LAB — CBC
HCT: 44.2 % (ref 36.0–46.0)
Hemoglobin: 14.5 g/dL (ref 12.0–15.0)
MCH: 29.6 pg (ref 26.0–34.0)
MCHC: 32.8 g/dL (ref 30.0–36.0)
MCV: 90.2 fL (ref 80.0–100.0)
NRBC: 0 % (ref 0.0–0.2)
Platelets: 359 10*3/uL (ref 150–400)
RBC: 4.9 MIL/uL (ref 3.87–5.11)
RDW: 12.7 % (ref 11.5–15.5)
WBC: 8.7 10*3/uL (ref 4.0–10.5)

## 2018-05-04 LAB — TYPE AND SCREEN
ABO/RH(D): A POS
Antibody Screen: NEGATIVE

## 2018-05-04 LAB — APTT: aPTT: 31 seconds (ref 24–36)

## 2018-05-04 NOTE — Patient Instructions (Signed)
Access Code: [email or web address]  URL: [email or web address]  Date: 05/04/2018  Prepared by: Letitia Libra   Exercises  Seated Pelvic Tilt - 10 reps - 3 sets - 1x daily - 7x weekly  Supine Diaphragmatic Breathing - 10 reps - 3x daily - 7x weekly  Supine Posterior Pelvic Tilt with Pelvic Floor Contraction - 15 reps - 2 sets - 1x daily - 7x weekly  Supine Pelvic Floor Contraction - 3-5x daily - 7x weekly  Half Kneeling Hip Flexor Stretch - 3 reps - 5 breaths hold - 1x daily - 7x weekly  Seated Alternating Side Stretch with Arm Overhead - 3 reps - 5 breaths hold - 1x daily - 7x weekly  Seated Piriformis Stretch with Trunk Bend - 3 reps - 5 breaths hold - 1x daily - 7x weekly

## 2018-05-04 NOTE — Therapy (Signed)
[place] MAIN [facility] SERVICES [address] [place], [place], [address] Phone: [phone]   Fax:  [phone]  Physical Therapy Treatment  Patient Details  Name: Michelle Mejia MRN: [id number] Date of Birth: [birth year] Referring Provider (PT): Zara Council   Encounter Date: 05/04/2018    Past Medical History:  Diagnosis Date  . Abnormal Pap smear of cervix   . Arthritis    hips   . BRCA negative 02/04/2012   Sample completed by [facility]. Performing laboratory: [facility]. Assession #:[id number] LD  . Cystitis   . Family history of adverse reaction to anesthesia    pts sisters bp drops with epi- pt collapsed after colonoscopy  . Family history of breast cancer   . GERD (gastroesophageal reflux disease)    occ-tums prn  . Headache    h/o migraines  . History of kidney stones    h/o  . Increased risk of breast cancer 2013, 2019   riskscore=28.6% due to [place] (not BRIP1 mutation)  . 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 APPENDECTOMY    . BREAST BIOPSY Right 07/06/2001   Stereotactic biopsy showing fibrocystic changes, apocrine metaplasia and ductal adenosis, ductal epithelial hyperplasia without atypia.  Marland Kitchen BREAST BIOPSY Right 11/10/2016   benign, dicordant per radiologist  . BREAST CYST ASPIRATION Left 1994  . COLONOSCOPY N/A 04/02/2015   Procedure: COLONOSCOPY;  Surgeon: Manya Silvas, MD;  Location: [facility] ENDOSCOPY;  Service: Endoscopy;  Laterality: N/A;  . TONSILLECTOMY      There were no vitals filed for this visit.    Pelvic Floor Physical Therapy Treatment Note  SCREENING  Changes in medications, allergies, or medical history?: no    SUBJECTIVE  Patient reports: She had some pain following last session but it decreased. Leakage is about the same.  Precautions:  Hysterectomy  upcoming.  Pain update:  Location of pain: Hip pain Current pain:  2/10  Max pain:  3/10 Least pain:  0/10 Nature of pain: achy  Patient Goals: To not have leakage and to be able to decrease nocturia. Not to get bladder infections.   OBJECTIVE  Changes in:  Abdominal: Pt. Able to achieve coordinated pelvic tilt with breathing with moderate cueing tapered to no cues within the session.  Pelvic floor: TTP throughout, R anterior PR/PC referred posteriorly and L anterior PR/PC referred anteriorly into L hip. Strength following treatment was ~ 2+/5 and well coordinated.  INTERVENTIONS THIS SESSION: NM re-ed: Provided tactile, verbal, and visual feedback to educate Pt. On diaphragmatic breathing, rib expansion, and recruitment/feeling her PFM to facilitate coordination and use with therapeutic exercises. Manual: Performed TP release to all PFM internally for decreased spasm and pain as well as improved ability to recruit PFM with decreased active insufficiency. Therex: Educated on and practiced posterior pelvic tilts in seated and supine with breathing for TA/deep core strengthening. Self-care: Educated on how to guide gentle return to activity and use breathing to decrease swelling and increase healing time as well as self scar massage to maintain appropriate length of scar tissue and decrease risk of pain and dysfunction following hysterectomy. Educated on how her alignment and muscular imbalances are contributing to her hip bursitis.  Total time: 60 min.                         PT  Education - 05/04/18 0944    Education Details  See Pt. Instructions and Interventions this session.    Person(s) Educated  Patient    Methods  Explanation;Demonstration;Verbal cues;Handout;Tactile cues    Comprehension  Verbalized understanding;Returned demonstration;Verbal cues required;Tactile cues required;Need further instruction       PT Short Term Goals - 05/04/18 1610       PT SHORT TERM GOAL #1   Title  Patient will demonstrate a coordinated contraction, relaxation, and bulge of the pelvic floor muscles to demonstrate functional recruitment and motion and allow for further strengthening.    Baseline  Pt. deomonstrates paradoxical motion of PFM with breathing and coughing and difficulty lengthening the PFM    Time  6    Period  Weeks    Status  Achieved    Target Date  06/02/18      PT SHORT TERM GOAL #2   Title  Patient will demonstrate coordinated diaphragmatic breathing with pelvic tilts to demonstrate improved control of diaphragm and TA, to allow for further strengthening of core musculature and decreased pelvic floor spasm.    Baseline  Demonstrates dysfunctional breathing.    Time  6    Period  Weeks    Status  New    Target Date  06/02/18      PT SHORT TERM GOAL #3   Title  Patient will demonstrate improved pelvic alignment and balance of musculature surrounding the pelvis to facilitate decreased PFM spasms and decrease pelvic pain.    Baseline  Pt demonstrates leg-length discrepancy     Time  6    Period  Weeks    Status  New    Target Date  06/02/18        PT Long Term Goals - 05/04/18 0953      PT LONG TERM GOAL #1   Title  Patient will report no episodes of SUI over the course of the prior two weeks to demonstrate improved functional ability.    Time  12    Period  Weeks    Status  On-going    Target Date  07/14/18      PT LONG TERM GOAL #2   Title  Patient will score at or below 39/300 on the PFDI and 0/100 on the UIQ to demonstrate a clinically meaningful decrease in disability and distress due to pelvic floor dysfunction.    Baseline  PFDI: 84/300, UIQ: 5/100    Time  12    Period  Weeks    Status  New    Target Date  07/14/18      PT LONG TERM GOAL #3   Title  Patient will report no pain with intercourse to demonstrate improved functional ability.    Baseline  occasional pain with initial penetration and deep thrusting     Time  12    Period  Weeks    Status  New    Target Date  07/14/18      PT LONG TERM GOAL #4   Title  Pt. will demonstrate decreased nocturia to < or = 1x/night    Baseline  nocturia 2-3x/night    Time  12    Period  Weeks    Status  New    Target Date  07/14/18            Plan - 05/04/18 0945    Clinical Impression Statement  Pt. responded well to all treatment today, demonstrating decreased spasm and tenderness as well  as improved recruitment and coordination of the muscles within and around the pelvis. She was able to demonstrate appropriate performance of all new exercises and understanding of new education concepts. She is having a vaginal hysterectomy this Thursday but will return to PT three weeks after her surgery to continue strengthening and ensure optimal healing of tissue and decrease risk of problematic scar formation as well as prevent prolapse of bladder and colon. All treatment to be external for the first 6 weeks following surgury.    Clinical Presentation  Evolving    Clinical Decision Making  Moderate    Rehab Potential  Fair    Clinical Impairments Affecting Rehab Potential  Pt. is having a hysterectomy soon and will benefit from treatment but will likely have a set-back following operation and need further treatment.    PT Frequency  1x / week    PT Duration  12 weeks    PT Treatment/Interventions  ADLs/Self Care Home Management;Biofeedback;Moist Heat;Traction;Electrical Stimulation;Gait training;Functional mobility training;Neuromuscular re-education;Balance training;Therapeutic exercise;Therapeutic activities;Patient/family education;Manual techniques;Dry needling;Passive range of motion;Scar mobilization;Energy conservation;Taping;Joint Manipulations;Spinal Manipulations    PT Next Visit Plan  Review HEP, assess incisions and review gentle scar massage/mobility, re-assess deep-core recruitment and stability and increase as able.     PT Home Exercise Plan   side-stretch, hip-flexor stretch, piriformis stretch,Diaphragmatic breathing, seated and supine posterior pelvic tilts with breathing,quick-flicks and long-hold kegels,.    Consulted and Agree with Plan of Care  Patient       Patient will benefit from skilled therapeutic intervention in order to improve the following deficits and impairments:  Increased fascial restricitons, Improper body mechanics, Pain, Cardiopulmonary status limiting activity, Decreased coordination, Decreased mobility, Increased muscle spasms, Postural dysfunction, Decreased activity tolerance, Decreased endurance, Decreased range of motion, Decreased strength, Difficulty walking  Visit Diagnosis: Abnormal posture  Sacrococcygeal disorders, not elsewhere classified  Other muscle spasm     Problem List Patient Active Problem List   Diagnosis Date Noted  . Fibroid 02/17/2018  . Abnormal genetic test 02/15/2018  . History of uterine fibroid 02/15/2018  . High risk of ovarian cancer 01/19/2018  . Family history of breast cancer 01/19/2018  . Chest pain with high risk for cardiac etiology 02/16/2017  . SOB (shortness of breath) on exertion 02/16/2017  . Abnormal mammogram of right breast 11/06/2016  . Joint pain 01/13/2015   Willa Rough DPT, ATC Willa Rough 05/04/2018, 10:09 AM  [place] MAIN [facility] SERVICES [address] [place], [place], [address] Phone: [phone]   Fax:  [phone]  Name: Michelle Mejia MRN: [id number] Date of Birth: [birth year]

## 2018-05-05 MED ORDER — CLINDAMYCIN PHOSPHATE 900 MG/50ML IV SOLN
900.0000 mg | Freq: Once | INTRAVENOUS | Status: AC
  Administered 2018-05-06: 900 mg via INTRAVENOUS

## 2018-05-06 ENCOUNTER — Ambulatory Visit: Payer: Managed Care, Other (non HMO)

## 2018-05-06 ENCOUNTER — Encounter: Payer: Self-pay | Admitting: Obstetrics & Gynecology

## 2018-05-06 ENCOUNTER — Encounter
Admission: RE | Disposition: A | Discharge status: Home / Self Care | Payer: Self-pay | Source: Home / Self Care | Attending: Obstetrics & Gynecology

## 2018-05-06 ENCOUNTER — Telehealth: Payer: Self-pay | Admitting: Obstetrics & Gynecology

## 2018-05-06 ENCOUNTER — Other Ambulatory Visit: Payer: Self-pay

## 2018-05-06 ENCOUNTER — Ambulatory Visit
Admission: RE | Admit: 2018-05-06 | Discharge: 2018-05-06 | Disposition: A | Discharge status: Home / Self Care | Payer: Managed Care, Other (non HMO) | Attending: Obstetrics & Gynecology | Admitting: Obstetrics & Gynecology

## 2018-05-06 DIAGNOSIS — D252 Subserosal leiomyoma of uterus: Secondary | ICD-10-CM | POA: Insufficient documentation

## 2018-05-06 DIAGNOSIS — Z1502 Genetic susceptibility to malignant neoplasm of ovary: Secondary | ICD-10-CM | POA: Insufficient documentation

## 2018-05-06 DIAGNOSIS — D251 Intramural leiomyoma of uterus: Secondary | ICD-10-CM | POA: Insufficient documentation

## 2018-05-06 DIAGNOSIS — Z8041 Family history of malignant neoplasm of ovary: Secondary | ICD-10-CM | POA: Insufficient documentation

## 2018-05-06 DIAGNOSIS — Z4002 Encounter for prophylactic removal of ovary: Secondary | ICD-10-CM | POA: Insufficient documentation

## 2018-05-06 DIAGNOSIS — Z1589 Genetic susceptibility to other disease: Secondary | ICD-10-CM | POA: Diagnosis present

## 2018-05-06 DIAGNOSIS — D219 Benign neoplasm of connective and other soft tissue, unspecified: Secondary | ICD-10-CM | POA: Diagnosis present

## 2018-05-06 DIAGNOSIS — R898 Other abnormal findings in specimens from other organs, systems and tissues: Secondary | ICD-10-CM | POA: Diagnosis present

## 2018-05-06 DIAGNOSIS — Z803 Family history of malignant neoplasm of breast: Secondary | ICD-10-CM

## 2018-05-06 DIAGNOSIS — Z9189 Other specified personal risk factors, not elsewhere classified: Secondary | ICD-10-CM

## 2018-05-06 HISTORY — PX: CYSTOSCOPY: SHX5120

## 2018-05-06 LAB — ABO/RH: ABO/RH(D): A POS

## 2018-05-06 LAB — POCT PREGNANCY, URINE: PREG TEST UR: NEGATIVE

## 2018-05-06 SURGERY — HYSTERECTOMY, TOTAL, LAPAROSCOPIC, WITH BILATERAL SALPINGO-OOPHORECTOMY
Anesthesia: General

## 2018-05-06 MED ORDER — OXYCODONE-ACETAMINOPHEN 5-325 MG PO TABS: 1.0000 | ORAL_TABLET | ORAL | 0 refills | Status: DC | PRN

## 2018-05-06 MED ORDER — FAMOTIDINE 20 MG PO TABS
20.0000 mg | ORAL_TABLET | Freq: Once | ORAL | Status: AC
  Administered 2018-05-06: 20 mg via ORAL

## 2018-05-06 MED ORDER — ONDANSETRON HCL 4 MG/2ML IJ SOLN
INTRAMUSCULAR | Status: DC | PRN
  Administered 2018-05-06: 4 mg via INTRAVENOUS

## 2018-05-06 MED ORDER — ONDANSETRON HCL 4 MG/2ML IJ SOLN
4.0000 mg | Freq: Once | INTRAMUSCULAR | Status: AC | PRN
  Administered 2018-05-06: 4 mg via INTRAVENOUS

## 2018-05-06 MED ORDER — FENTANYL CITRATE (PF) 100 MCG/2ML IJ SOLN
INTRAMUSCULAR | Status: AC
  Filled 2018-05-06: qty 2

## 2018-05-06 MED ORDER — ROCURONIUM BROMIDE 100 MG/10ML IV SOLN
INTRAVENOUS | Status: DC | PRN
  Administered 2018-05-06: 50 mg via INTRAVENOUS

## 2018-05-06 MED ORDER — FAMOTIDINE 20 MG PO TABS
ORAL_TABLET | ORAL | Status: AC
  Administered 2018-05-06: 20 mg via ORAL
  Filled 2018-05-06: qty 1

## 2018-05-06 MED ORDER — DEXAMETHASONE SODIUM PHOSPHATE 10 MG/ML IJ SOLN
INTRAMUSCULAR | Status: DC | PRN
  Administered 2018-05-06: 10 mg via INTRAVENOUS

## 2018-05-06 MED ORDER — ACETAMINOPHEN 650 MG RE SUPP
650.0000 mg | RECTAL | Status: DC | PRN
  Filled 2018-05-06: qty 1

## 2018-05-06 MED ORDER — EPHEDRINE SULFATE 50 MG/ML IJ SOLN
INTRAMUSCULAR | Status: AC
  Filled 2018-05-06: qty 1

## 2018-05-06 MED ORDER — PHENYLEPHRINE HCL 10 MG/ML IJ SOLN
INTRAMUSCULAR | Status: DC | PRN
  Administered 2018-05-06: 100 ug via INTRAVENOUS
  Administered 2018-05-06: 200 ug via INTRAVENOUS
  Administered 2018-05-06 (×2): 100 ug via INTRAVENOUS

## 2018-05-06 MED ORDER — KETOROLAC TROMETHAMINE 30 MG/ML IJ SOLN
30.0000 mg | Freq: Four times a day (QID) | INTRAMUSCULAR | Status: DC
  Administered 2018-05-06: 30 mg via INTRAVENOUS

## 2018-05-06 MED ORDER — MEPERIDINE HCL 50 MG/ML IJ SOLN: 6.2500 mg | INTRAMUSCULAR | Status: DC | PRN

## 2018-05-06 MED ORDER — FENTANYL CITRATE (PF) 100 MCG/2ML IJ SOLN
25.0000 ug | INTRAMUSCULAR | Status: DC | PRN
  Administered 2018-05-06 (×2): 50 ug via INTRAVENOUS

## 2018-05-06 MED ORDER — SUGAMMADEX SODIUM 200 MG/2ML IV SOLN
INTRAVENOUS | Status: DC | PRN
  Administered 2018-05-06: 200 mg via INTRAVENOUS

## 2018-05-06 MED ORDER — OXYCODONE-ACETAMINOPHEN 5-325 MG PO TABS
ORAL_TABLET | ORAL | Status: AC
  Filled 2018-05-06: qty 1

## 2018-05-06 MED ORDER — ACETAMINOPHEN 325 MG PO TABS: 650.0000 mg | ORAL_TABLET | ORAL | Status: DC | PRN

## 2018-05-06 MED ORDER — BUPIVACAINE HCL (PF) 0.5 % IJ SOLN
INTRAMUSCULAR | Status: DC | PRN
  Administered 2018-05-06: 10 mL

## 2018-05-06 MED ORDER — LACTATED RINGERS IV SOLN
INTRAVENOUS | Status: DC
  Administered 2018-05-06: 09:00:00 via INTRAVENOUS

## 2018-05-06 MED ORDER — MORPHINE SULFATE (PF) 4 MG/ML IV SOLN: 1.0000 mg | INTRAVENOUS | Status: DC | PRN

## 2018-05-06 MED ORDER — LACTATED RINGERS IV SOLN: INTRAVENOUS | Status: DC

## 2018-05-06 MED ORDER — KETOROLAC TROMETHAMINE 30 MG/ML IJ SOLN
INTRAMUSCULAR | Status: AC
  Filled 2018-05-06: qty 1

## 2018-05-06 MED ORDER — PROPOFOL 10 MG/ML IV BOLUS
INTRAVENOUS | Status: DC | PRN
  Administered 2018-05-06: 160 mg via INTRAVENOUS

## 2018-05-06 MED ORDER — FENTANYL CITRATE (PF) 100 MCG/2ML IJ SOLN
INTRAMUSCULAR | Status: DC | PRN
  Administered 2018-05-06 (×2): 50 ug via INTRAVENOUS
  Administered 2018-05-06: 25 ug via INTRAVENOUS
  Administered 2018-05-06: 50 ug via INTRAVENOUS
  Administered 2018-05-06: 25 ug via INTRAVENOUS

## 2018-05-06 MED ORDER — OXYCODONE-ACETAMINOPHEN 5-325 MG PO TABS
1.0000 | ORAL_TABLET | ORAL | Status: DC | PRN
  Administered 2018-05-06: 1 via ORAL

## 2018-05-06 MED ORDER — SUGAMMADEX SODIUM 200 MG/2ML IV SOLN
INTRAVENOUS | Status: AC
  Filled 2018-05-06: qty 2

## 2018-05-06 MED ORDER — ONDANSETRON HCL 4 MG/2ML IJ SOLN
INTRAMUSCULAR | Status: AC
  Administered 2018-05-06: 4 mg via INTRAVENOUS
  Filled 2018-05-06: qty 2

## 2018-05-06 MED ORDER — LIDOCAINE HCL (CARDIAC) PF 100 MG/5ML IV SOSY
PREFILLED_SYRINGE | INTRAVENOUS | Status: DC | PRN
  Administered 2018-05-06: 100 mg via INTRAVENOUS

## 2018-05-06 MED ORDER — EPHEDRINE SULFATE 50 MG/ML IJ SOLN
INTRAMUSCULAR | Status: DC | PRN
  Administered 2018-05-06: 10 mg via INTRAVENOUS

## 2018-05-06 MED ORDER — CLINDAMYCIN PHOSPHATE 900 MG/50ML IV SOLN
INTRAVENOUS | Status: AC
  Filled 2018-05-06: qty 50

## 2018-05-06 MED ORDER — PROPOFOL 10 MG/ML IV BOLUS
INTRAVENOUS | Status: AC
  Filled 2018-05-06: qty 20

## 2018-05-06 MED ORDER — OXYCODONE HCL 5 MG PO TABS: 5.0000 mg | ORAL_TABLET | Freq: Once | ORAL | Status: DC | PRN

## 2018-05-06 MED ORDER — BUPIVACAINE HCL (PF) 0.5 % IJ SOLN
INTRAMUSCULAR | Status: AC
  Filled 2018-05-06: qty 30

## 2018-05-06 MED ORDER — ONDANSETRON HCL 4 MG/2ML IJ SOLN
INTRAMUSCULAR | Status: AC
  Filled 2018-05-06: qty 2

## 2018-05-06 MED ORDER — DEXAMETHASONE SODIUM PHOSPHATE 10 MG/ML IJ SOLN
INTRAMUSCULAR | Status: AC
  Filled 2018-05-06: qty 1

## 2018-05-06 MED ORDER — OXYCODONE HCL 5 MG/5ML PO SOLN: 5.0000 mg | Freq: Once | ORAL | Status: DC | PRN

## 2018-05-06 MED ORDER — MIDAZOLAM HCL 2 MG/2ML IJ SOLN
INTRAMUSCULAR | Status: DC | PRN
  Administered 2018-05-06: 2 mg via INTRAVENOUS

## 2018-05-06 MED ORDER — MIDAZOLAM HCL 2 MG/2ML IJ SOLN
INTRAMUSCULAR | Status: AC
  Filled 2018-05-06: qty 2

## 2018-05-06 SURGICAL SUPPLY — 52 items
BAG URINE DRAINAGE (UROLOGICAL SUPPLIES) ×4 IMPLANT
BLADE SURG SZ11 CARB STEEL (BLADE) ×4 IMPLANT
CANISTER SUCT 1200ML W/VALVE (MISCELLANEOUS) ×4 IMPLANT
CATH FOLEY 2WAY  5CC 16FR (CATHETERS) ×2
CATH URTH 16FR FL 2W BLN LF (CATHETERS) ×2 IMPLANT
CHLORAPREP W/TINT 26ML (MISCELLANEOUS) ×4 IMPLANT
COVER WAND RF STERILE (DRAPES) ×4 IMPLANT
DEFOGGER SCOPE WARMER CLEARIFY (MISCELLANEOUS) ×4 IMPLANT
DERMABOND ADVANCED (GAUZE/BANDAGES/DRESSINGS) ×2
DERMABOND ADVANCED .7 DNX12 (GAUZE/BANDAGES/DRESSINGS) ×2 IMPLANT
DEVICE SUTURE ENDOST 10MM (ENDOMECHANICALS) IMPLANT
DRAPE CAMERA CLOSED 9X96 (DRAPES) ×4 IMPLANT
DRSG TEGADERM 2-3/8X2-3/4 SM (GAUZE/BANDAGES/DRESSINGS) IMPLANT
GLOVE BIO SURGEON STRL SZ8 (GLOVE) ×20 IMPLANT
GLOVE INDICATOR 8.0 STRL GRN (GLOVE) ×4 IMPLANT
GOWN STRL REUS W/ TWL LRG LVL3 (GOWN DISPOSABLE) ×2 IMPLANT
GOWN STRL REUS W/ TWL XL LVL3 (GOWN DISPOSABLE) ×4 IMPLANT
GOWN STRL REUS W/TWL LRG LVL3 (GOWN DISPOSABLE) ×2
GOWN STRL REUS W/TWL XL LVL3 (GOWN DISPOSABLE) ×4
GRASPER SUT TROCAR 14GX15 (MISCELLANEOUS) ×4 IMPLANT
IRRIGATION STRYKERFLOW (MISCELLANEOUS) ×2 IMPLANT
IRRIGATOR STRYKERFLOW (MISCELLANEOUS) ×4
IV LACTATED RINGERS 1000ML (IV SOLUTION) ×8 IMPLANT
KIT PINK PAD W/HEAD ARE REST (MISCELLANEOUS) ×4
KIT PINK PAD W/HEAD ARM REST (MISCELLANEOUS) ×2 IMPLANT
KIT TURNOVER CYSTO (KITS) ×4 IMPLANT
LABEL OR SOLS (LABEL) ×4 IMPLANT
MANIPULATOR VCARE LG CRV RETR (MISCELLANEOUS) IMPLANT
MANIPULATOR VCARE SML CRV RETR (MISCELLANEOUS) ×4 IMPLANT
MANIPULATOR VCARE STD CRV RETR (MISCELLANEOUS) IMPLANT
NEEDLE VERESS 14GA 120MM (NEEDLE) ×4 IMPLANT
NS IRRIG 500ML POUR BTL (IV SOLUTION) ×4 IMPLANT
OCCLUDER COLPOPNEUMO (BALLOONS) ×4 IMPLANT
PACK GYN LAPAROSCOPIC (MISCELLANEOUS) ×4 IMPLANT
PAD OB MATERNITY 4.3X12.25 (PERSONAL CARE ITEMS) ×4 IMPLANT
PAD PREP 24X41 OB/GYN DISP (PERSONAL CARE ITEMS) ×4 IMPLANT
PORT ACCESS TROCAR AIRSEAL 12 (TROCAR) ×2 IMPLANT
PORT ACCESS TROCAR AIRSEAL 5M (TROCAR) ×2
SCISSORS METZENBAUM CVD 33 (INSTRUMENTS) IMPLANT
SET CYSTO W/LG BORE CLAMP LF (SET/KITS/TRAYS/PACK) ×4 IMPLANT
SET TRI-LUMEN FLTR TB AIRSEAL (TUBING) ×4 IMPLANT
SHEARS HARMONIC ACE PLUS 36CM (ENDOMECHANICALS) ×4 IMPLANT
SLEEVE ENDOPATH XCEL 5M (ENDOMECHANICALS) ×4 IMPLANT
SPONGE GAUZE 2X2 8PLY STER LF (GAUZE/BANDAGES/DRESSINGS)
SPONGE GAUZE 2X2 8PLY STRL LF (GAUZE/BANDAGES/DRESSINGS) IMPLANT
SURGILUBE 2OZ TUBE FLIPTOP (MISCELLANEOUS) ×4 IMPLANT
SUT ENDO VLOC 180-0-8IN (SUTURE) IMPLANT
SUT VIC AB 0 CT1 36 (SUTURE) ×4 IMPLANT
SUT VIC AB 4-0 FS2 27 (SUTURE) ×4 IMPLANT
SYR 10ML LL (SYRINGE) ×4 IMPLANT
SYR 50ML LL SCALE MARK (SYRINGE) ×4 IMPLANT
TROCAR XCEL NON-BLD 5MMX100MML (ENDOMECHANICALS) ×4 IMPLANT

## 2018-05-06 NOTE — Anesthesia Preprocedure Evaluation (Signed)
Anesthesia Evaluation  Patient identified by MRN, date of birth, ID band Patient awake    Reviewed: Allergy & Precautions, NPO status , Patient's Chart, lab work & pertinent test results  History of Anesthesia Complications Negative for: history of anesthetic complications  Airway Mallampati: II  TM Distance: >3 FB Neck ROM: Full    Dental no notable dental hx.    Pulmonary neg pulmonary ROS, neg sleep apnea, neg COPD,    breath sounds clear to auscultation- rhonchi (-) wheezing      Cardiovascular Exercise Tolerance: Good (-) hypertension(-) CAD, (-) Past MI, (-) Cardiac Stents and (-) CABG  Rhythm:Regular Rate:Normal - Systolic murmurs and - Diastolic murmurs    Neuro/Psych  Headaches, neg Seizures negative psych ROS   GI/Hepatic Neg liver ROS, GERD  ,  Endo/Other  negative endocrine ROSneg diabetes  Renal/GU negative Renal ROS     Musculoskeletal  (+) Arthritis ,   Abdominal (+) + obese,   Peds  Hematology negative hematology ROS (+)   Anesthesia Other Findings Past Medical History: No date: Abnormal Pap smear of cervix No date: Arthritis     Comment:  hips  02/04/2012: BRCA negative     Comment:  Sample completed by [facility]. Performing               laboratory: [facility]. Assession #:[id number] LD No date: Cystitis No date: Family history of adverse reaction to anesthesia     Comment:  pts sisters bp drops with epi- pt collapsed after               colonoscopy No date: Family history of breast cancer No date: GERD (gastroesophageal reflux disease)     Comment:  occ-tums prn No date: Headache     Comment:  h/o migraines No date: History of kidney stones     Comment:  h/o 2013, 2019: Increased risk of breast cancer     Comment:  riskscore=28.6% due to [place] (not BRIP1 mutation) 89/1694: Monoallelic mutation of BRIP1 gene     Comment:  [facility] MyRisk postive; increased risk of ovar cancer   Reproductive/Obstetrics                             Anesthesia Physical Anesthesia Plan  ASA: II  Anesthesia Plan: General   Post-op Pain Management:    Induction: Intravenous  PONV Risk Score and Plan: 2 and Ondansetron, Dexamethasone and Midazolam  Airway Management Planned: Oral ETT  Additional Equipment:   Intra-op Plan:   Post-operative Plan: Extubation in OR  Informed Consent: I have reviewed the patients History and Physical, chart, labs and discussed the procedure including the risks, benefits and alternatives for the proposed anesthesia with the patient or authorized representative who has indicated his/her understanding and acceptance.     Dental advisory given  Plan Discussed with: CRNA and Anesthesiologist  Anesthesia Plan Comments:         Anesthesia Quick Evaluation

## 2018-05-06 NOTE — Discharge Instructions (Signed)
AMBULATORY SURGERY  DISCHARGE INSTRUCTIONS   1) The drugs that you were given will stay in your system until tomorrow so for the next 24 hours you should not:  A) Drive an automobile B) Make any legal decisions C) Drink any alcoholic beverage   2) You may resume regular meals tomorrow.  Today it is better to start with liquids and gradually work up to solid foods.  You may eat anything you prefer, but it is better to start with liquids, then soup and crackers, and gradually work up to solid foods.   3) Please notify your doctor immediately if you have any unusual bleeding, trouble breathing, redness and pain at the surgery site, drainage, fever, or pain not relieved by medication.    4) Additional Instructions:        Please contact your physician with any problems or Same Day Surgery at [phone], Monday through Friday 6 am to 4 pm, or [place] at [facility] number at [phone].Total Laparoscopic Hysterectomy, Care After This sheet gives you information about how to care for yourself after your procedure. Your health care provider may also give you more specific instructions. If you have problems or questions, contact your health care provider. What can I expect after the procedure? After the procedure, it is common to have:  Pain and bruising around your incisions.  A sore throat, if a breathing tube was used during surgery.  Fatigue.  Poor appetite.  Less interest in sex. If your ovaries were also removed, it is also common to have symptoms of menopause such as hot flashes, night sweats, and lack of sleep (insomnia). Follow these instructions at home: Bathing  Do not take baths, swim, or use a hot tub until your health care provider approves. You may need to only take showers for 2-3 weeks.  Keep your bandage (dressing) dry until your health care provider says it can be removed. Incision care   Follow instructions from your health care provider about  how to take care of your incisions. Make sure you: ? Wash your hands with soap and water before you change your dressing. If soap and water are not available, use hand sanitizer. ? Change your dressing as told by your health care provider. ? Leave stitches (sutures), skin glue, or adhesive strips in place. These skin closures may need to stay in place for 2 weeks or longer. If adhesive strip edges start to loosen and curl up, you may trim the loose edges. Do not remove adhesive strips completely unless your health care provider tells you to do that.  Check your incision area every day for signs of infection. Check for: ? Redness, swelling, or pain. ? Fluid or blood. ? Warmth. ? Pus or a bad smell. Activity  Get plenty of rest and sleep.  Do not lift anything that is heavier than 10 lbs (4.5 kg) for one month after surgery, or as long as told by your health care provider.  Do not drive or use heavy machinery while taking prescription pain medicine.  Do not drive for 24 hours if you were given a medicine to help you relax (sedative).  Return to your normal activities as told by your health care provider. Ask your health care provider what activities are safe for you. Lifestyle   Do not use any products that contain nicotine or tobacco, such as cigarettes and e-cigarettes. These can delay healing. If you need help quitting, ask your health care provider.  Do not drink  alcohol until your health care provider approves. General instructions  Do not douche, use tampons, or have sex for at least 6 weeks, or as told by your health care provider.  Take over-the-counter and prescription medicines only as told by your health care provider.  To monitor yourself for a fever, take your temperature at least once a day during recovery.  If you struggle with physical or emotional changes after your procedure, speak with your health care provider or a therapist.  To prevent or treat constipation  while you are taking prescription pain medicine, your health care provider may recommend that you: ? Drink enough fluid to keep your urine clear or pale yellow. ? Take over-the-counter or prescription medicines. ? Eat foods that are high in fiber, such as fresh fruits and vegetables, whole grains, and beans. ? Limit foods that are high in fat and processed sugars, such as fried and sweet foods.  Keep all follow-up visits as told by your health care provider. This is important. Contact a health care provider if:  You have chills or a fever.  You have redness, swelling, or pain around an incision.  You have fluid or blood coming from an incision.  Your incision feels warm to the touch.  You have pus or a bad smell coming from an incision.  An incision breaks open.  You feel dizzy or light-headed.  You have pain or bleeding when you urinate.  You have diarrhea, nausea, or vomiting that does not go away.  You have abnormal vaginal discharge.  You have a rash.  You have pain that does not get better with medicine. Get help right away if:  You have a fever and your symptoms suddenly get worse.  You have severe abdominal pain.  You have chest pain.  You have shortness of breath.  You faint.  You have pain, swelling, or redness on your leg.  You have heavy vaginal bleeding with blood clots. Summary  After the procedure it is common to have abdominal pain. Your provider will give you medication for this.  Do not take baths, swim, or use a hot tub until your health care provider approves.  Do not lift anything that is heavier than 10 lbs (4.5 kg) for one month after surgery, or as long as told by your health care provider.  Notify your provider if you have any signs or symptoms of infection after the procedure. This information is not intended to replace advice given to you by your health care provider. Make sure you discuss any questions you have with your health care  provider. Document Released: 01/19/2013 Document Revised: 06/11/2016 Document Reviewed: 06/11/2016 Elsevier Interactive Patient Education  2019 [employer].

## 2018-05-06 NOTE — Anesthesia Post-op Follow-up Note (Signed)
Anesthesia QCDR form completed.        

## 2018-05-06 NOTE — Telephone Encounter (Signed)
Left message on voicemail letting patient know that her FMLA paperwork has been faxed through.   Thanks

## 2018-05-06 NOTE — Transfer of Care (Signed)
Immediate Anesthesia Transfer of Care Note  Patient: Michelle Mejia  Procedure(s) Performed: HYSTERECTOMY TOTAL LAPAROSCOPIC BSO (Bilateral ) CYSTOSCOPY (N/A )  Patient Location: PACU  Anesthesia Type:General  Level of Consciousness: drowsy and patient cooperative  Airway & Oxygen Therapy: Patient Spontanous Breathing and Patient connected to face mask oxygen  Post-op Assessment: Report given to RN and Post -op Vital signs reviewed and stable  Post vital signs: Reviewed and stable  Last Vitals:  Vitals Value Taken Time  BP 118/69 05/06/2018 10:53 AM  Temp    Pulse 87 05/06/2018 10:55 AM  Resp 30 05/06/2018 10:55 AM  SpO2 98 % 05/06/2018 10:55 AM  Vitals shown include unvalidated device data.  Last Pain:  Vitals:   05/06/18 1051  TempSrc:   PainSc: (P) 0-No pain         Complications: No apparent anesthesia complications

## 2018-05-06 NOTE — Anesthesia Procedure Notes (Signed)
Procedure Name: Intubation Date/Time: 05/06/2018 9:45 AM Performed by: Emmie Niemann, MD Pre-anesthesia Checklist: Patient identified, Emergency Drugs available, Suction available, Patient being monitored and Timeout performed Patient Re-evaluated:Patient Re-evaluated prior to induction Oxygen Delivery Method: Circle system utilized Preoxygenation: Pre-oxygenation with 100% oxygen Induction Type: IV induction Ventilation: Mask ventilation without difficulty Laryngoscope Size: Miller and 2 Grade View: Grade II Tube type: Oral Tube size: 7.0 mm Number of attempts: 1 Placement Confirmation: ETT inserted through vocal cords under direct vision,  positive ETCO2 and breath sounds checked- equal and bilateral Secured at: 21 cm Tube secured with: Tape Dental Injury: Teeth and Oropharynx as per pre-operative assessment

## 2018-05-06 NOTE — Anesthesia Postprocedure Evaluation (Signed)
Anesthesia Post Note  Patient: Michelle Mejia  Procedure(s) Performed: HYSTERECTOMY TOTAL LAPAROSCOPIC BSO (Bilateral ) CYSTOSCOPY (N/A )  Patient location during evaluation: PACU Anesthesia Type: General Level of consciousness: awake and alert and oriented Pain management: pain level controlled Vital Signs Assessment: post-procedure vital signs reviewed and stable Respiratory status: spontaneous breathing, nonlabored ventilation and respiratory function stable Cardiovascular status: blood pressure returned to baseline and stable Postop Assessment: no signs of nausea or vomiting Anesthetic complications: no     Last Vitals:  Vitals:   05/06/18 1146 05/06/18 1207  BP:  110/61  Pulse: 95 97  Resp: 13 16  Temp: 36.9 C (!) 35.5 C  SpO2: 92% 96%    Last Pain:  Vitals:   05/06/18 1207  TempSrc: Temporal  PainSc: 0-No pain                 Furqan Gosselin

## 2018-05-06 NOTE — Interval H&P Note (Signed)
History and Physical Interval Note:  05/06/2018 8:48 AM  Michelle Mejia  has presented today for surgery, with the diagnosis of [place]  The various methods of treatment have been discussed with the patient and family. After consideration of risks, benefits and other options for treatment, the patient has consented to  Procedure(s): HYSTERECTOMY TOTAL LAPAROSCOPIC BSO (N/A) CYSTOSCOPY (N/A) as a surgical intervention .  The patient's history has been reviewed, patient examined, no change in status, stable for surgery.  I have reviewed the patient's chart and labs.  Questions were answered to the patient's satisfaction.     Hoyt Koch

## 2018-05-06 NOTE — Op Note (Signed)
Operative Report:  PRE-OP DIAGNOSIS: FIBROID UTERUS,GENE RISK FOR OVARIAN CANCER,FAMILY HISTORY FOR OVARIAN CANCER   POST-OP DIAGNOSIS: FIBROID UTERUS,GENE RISK FOR OVARIAN CANCER,FAMILY HISTORY FOR OVARIAN CANCER   PROCEDURE: Procedure(s): HYSTERECTOMY TOTAL LAPAROSCOPIC BSO CYSTOSCOPY  SURGEON: Barnett Applebaum, MD, FACOG  ASSISTANT: Dr Georgianne Fick, No other capable assistant available, in surgery requiring high level assistant.  ANESTHESIA: General endotracheal anesthesia  ESTIMATED BLOOD LOSS: 5 mL  SPECIMENS: Uterus, Tubes, Ovaries.  COMPLICATIONS: None  DISPOSITION: stable to PACU  FINDINGS: Intraabdominal adhesions were not noted. Fibroid in uterus, 4cm estimated size.  PROCEDURE:  The patient was taken to the OR where anesthesia was administed. She was prepped and draped in the normal sterile fashion in the dorsal lithotomy position in the [place] stirrups. A time out was performed. A Graves speculum was inserted, the cervix was grasped with a single tooth tenaculum and the endometrial cavity was sounded. The cervix was progressively dilated to a size 18 [place] with [employer] dilators. A V-Care uterine manipulator was inserted in the usual fashion without incident. Gloves were changed and attention was turned to the abdomen.   An infraumbilical transverse 58mm skin incision was made with the scalpel after local anesthesia applied to the skin. A Veress-step needle was inserted in the usual fashion and confirmed using the hanging drop technique. A pneumoperitoneum was obtained by insufflation of CO2 (opening pressure of 64mmHg) to 37mmHg. A diagnostic laparoscopy was performed yielding the previously described findings. Attention was turned to the left lower quadrant where after visualization of the inferior epigastric vessels a 24mm skin incision was made with the scalpel. A 5 mm laparoscopic port was inserted. The same procedure was repeated in the right lower quadrant with a 20mm trocar.  Attention was turned to the left aspect of the uterus, where after visualization of the ureter, the round ligament was coagulated and transected using the 61mm Harmonic Scapel. The anterior and posterior leafs of the broad ligament were dissected off as the anterior one was coagulated and transected in a caudal direction towards the cuff of the uterine manipulator.  Attention was then turned to the left fallopian tube and ovary which was recognized by visualization of the fimbria. The infundibulopelvic ligament and its blood vessels were carefully coagulated and transected using the Harmonic scapel.  Attention was turned to the right aspect of the uterus where the same procedure was performed.  The vesicouterine reflection of the peritoneum was dissected with the harmonic scapel and the bladder flap was created bluntly.  The uterine vessels were coagulated and transected bilaterally using first bipolar cautery and then the harmonic scapel. A 360 degree, circumferential colpotomy was done to completely amputate the uterus with cervix and tubes. Once the specimen was amputated it was delivered through the vagina.   The colpotomy was repaired in a running fashion using a delayed absorbable suture with an endo-stitch device.  Vaginal exam confirms complete closure.  The cavity was copiously irrigated. A survey of the pelvic cavity revealed adequate hemostasis and no injury to bowel, bladder, or ureter.   A diagnostic cystoscopy was performed using saline distension of bladder with no lesions or injuries noted.  Bilateral urine flow from each ureteral orifice is visualized.  At this point the procedure was finalized. All the instruments were removed from the patient's body. Gas was expelled and patient is leveled.  Incisions are closed with skin adhesive.    Patient goes to recovery room in stable condition.  All sponge, instrument, and needle counts are correct  x2.     Barnett Applebaum, MD, [facility],  [facility] 05/06/2018  10:45 AM

## 2018-05-10 ENCOUNTER — Telehealth: Payer: Self-pay

## 2018-05-10 ENCOUNTER — Ambulatory Visit (INDEPENDENT_AMBULATORY_CARE_PROVIDER_SITE_OTHER): Payer: Managed Care, Other (non HMO) | Admitting: Obstetrics & Gynecology

## 2018-05-10 ENCOUNTER — Encounter: Payer: Self-pay | Admitting: Obstetrics & Gynecology

## 2018-05-10 VITALS — BP 130/90 | Ht 64.0 in | Wt 182.0 lb

## 2018-05-10 DIAGNOSIS — Z9889 Other specified postprocedural states: Secondary | ICD-10-CM

## 2018-05-10 DIAGNOSIS — Z90710 Acquired absence of both cervix and uterus: Secondary | ICD-10-CM

## 2018-05-10 DIAGNOSIS — R1031 Right lower quadrant pain: Secondary | ICD-10-CM

## 2018-05-10 LAB — SURGICAL PATHOLOGY

## 2018-05-10 NOTE — Telephone Encounter (Signed)
Can you add her to [facility] schedule

## 2018-05-10 NOTE — Telephone Encounter (Signed)
Should we work her in to be seen today?

## 2018-05-10 NOTE — Telephone Encounter (Signed)
Pt had hyst on the 23rd c PH.  C/o ever since she was in the recovery room she has had extreme pain on her right side; it's not getting better; she has trouble getting up and down; she can't stand up all the way; sometimes it is a burning, very sharp pain.  [phone]

## 2018-05-10 NOTE — Patient Instructions (Signed)
CT Scan Izora Gala to call Tuesday with scheduling  A CT scan (computed tomography scan) is an imaging scan. It uses X-rays and a computer to make detailed pictures of different areas inside the body. A CT scan can give more information than a regular X-ray exam. A CT scan provides data about internal organs, soft tissue structures, blood vessels, and bones. In this procedure, the pictures will be taken in a large machine that has an opening (CT scanner). Tell a health care provider about:  Any allergies you have.  All medicines you are taking, including vitamins, herbs, eye drops, creams, and over-the-counter medicines.  Any blood disorders you have.  Any surgeries you have had.  Any medical conditions you have.  Whether you are pregnant or may be pregnant. What are the risks? Generally, this is a safe procedure. However, problems may occur, including:  An allergic reaction to dyes.  Development of cancer from excessive exposure to radiation from multiple CT scans. This is rare. What happens before the procedure? Staying hydrated Follow instructions from your health care provider about hydration, which may include:  Up to 2 hours before the procedure - you may continue to drink clear liquids, such as water, clear fruit juice, black coffee, and plain tea. Eating and drinking restrictions Follow instructions from your health care provider about eating and drinking, which may include:  24 hours before the procedure - stop drinking caffeinated beverages, such as energy drinks, tea, soda, coffee, and hot chocolate.  8 hours before the procedure - stop eating heavy meals or foods such as meat, fried foods, or fatty foods.  6 hours before the procedure - stop eating light meals or foods, such as toast or cereal.  6 hours before the procedure - stop drinking milk or drinks that contain milk.  2 hours before the procedure - stop drinking clear liquids. General instructions  Remove any  jewelry.  Ask your health care provider about changing or stopping your regular medicines. This is especially important if you are taking diabetes medicines or blood thinners. What happens during the procedure?  You will lie on a table with your arms above your head.  An IV tube may be inserted into one of your veins.  The contrast dye may be injected into the IV tube. You may feel warm or have a metallic taste in your mouth.  The table you will be lying on will move into the CT scanner.  You will be able to see, hear, and talk to the person running the machine while you are in it. Follow that person's instructions.  The CT scanner will move around you to take pictures. Do not move while it is scanning. Staying still helps the scanner to get a good image.  When the best possible pictures have been taken, the machine will be turned off. The table will be moved out of the machine.  The IV tube will be removed. The procedure may vary among health care providers and hospitals. What happens after the procedure?  It is up to you to get the results of your procedure. Ask your health care provider, or the department that is doing the procedure, when your results will be ready. Summary  A CT scan is an imaging scan.  A CT scan uses X-rays and a computer to make detailed pictures of different areas of your body.  Follow instructions from your health care provider about eating and drinking before the procedure.  You will be able to  see, hear, and talk to the person running the machine while you are in it. Follow that person's instructions. This information is not intended to replace advice given to you by your health care provider. Make sure you discuss any questions you have with your health care provider. Document Released: 05/08/2004 Document Revised: 05/03/2016 Document Reviewed: 05/03/2016 Elsevier Interactive Patient Education  2019 [employer].

## 2018-05-10 NOTE — Telephone Encounter (Signed)
Coming today at 48

## 2018-05-10 NOTE — Progress Notes (Signed)
  Postoperative Follow-up Patient presents post op from [facility] BSO for ovarian cacner risk, also had fibroid, 1 week ago.  Subjective: Patient reports RLQ pain that is worse these past 2 days to the point where she has difficulty standing and moving around.  No radiation.  No fever, nausea, change in bladder or bowel habits.  BM last night and this am.  No bruising or incision concerns.  Objective: BP 130/90   Ht 5\' 4"  (1.626 m)   Wt 182 lb (82.6 kg)   LMP  (LMP Unknown) Comment: Possible Pre-Menopausal  BMI 31.24 kg/m  Physical Exam Constitutional:      General: She is not in acute distress.    Appearance: She is well-developed.     Comments: Gait slow but OK, more diffculty in rising from sit to stand than w walking itself  Cardiovascular:     Rate and Rhythm: Normal rate.  Pulmonary:     Effort: Pulmonary effort is normal.  Abdominal:     General: There is no distension.     Palpations: Abdomen is soft.     Tenderness: There is no abdominal tenderness.     Comments: Incision Healing Well   Musculoskeletal: Normal range of motion.  Neurological:     Mental Status: She is alert and oriented to person, place, and time.     Cranial Nerves: No cranial nerve deficit.  Skin:    General: Skin is warm and dry.   Assessment: s/p : [facility] BSO RLQ pain Out of proportion to usual post op pain  Plan: Exam reassuring, yet pain is of concern CT to assess for any internal concerns Continue restful activity levels IBF for pain, OK for percocet (does not like how it makes her feel), declines Vicodin or Tramadol (prior SE)   Hoyt Koch 05/10/2018, 4:49 PM

## 2018-05-11 ENCOUNTER — Ambulatory Visit
Admission: RE | Admit: 2018-05-11 | Discharge: 2018-05-11 | Disposition: A | Discharge status: Home / Self Care | Payer: Managed Care, Other (non HMO) | Source: Ambulatory Visit | Attending: Obstetrics & Gynecology | Admitting: Obstetrics & Gynecology

## 2018-05-11 ENCOUNTER — Ambulatory Visit: Payer: Managed Care, Other (non HMO)

## 2018-05-11 ENCOUNTER — Other Ambulatory Visit: Payer: Self-pay | Admitting: Obstetrics & Gynecology

## 2018-05-11 DIAGNOSIS — R1031 Right lower quadrant pain: Secondary | ICD-10-CM | POA: Insufficient documentation

## 2018-05-11 MED ORDER — IOPAMIDOL (ISOVUE-300) INJECTION 61%
100.0000 mL | Freq: Once | INTRAVENOUS | Status: AC | PRN
  Administered 2018-05-11: 100 mL via INTRAVENOUS

## 2018-05-17 ENCOUNTER — Ambulatory Visit: Payer: Managed Care, Other (non HMO)

## 2018-05-21 ENCOUNTER — Ambulatory Visit (INDEPENDENT_AMBULATORY_CARE_PROVIDER_SITE_OTHER): Payer: Managed Care, Other (non HMO) | Admitting: Obstetrics & Gynecology

## 2018-05-21 ENCOUNTER — Encounter: Payer: Self-pay | Admitting: Obstetrics & Gynecology

## 2018-05-21 VITALS — BP 120/80 | Ht 64.0 in | Wt 184.0 lb

## 2018-05-21 DIAGNOSIS — Z90710 Acquired absence of both cervix and uterus: Secondary | ICD-10-CM

## 2018-05-21 NOTE — Progress Notes (Signed)
  Postoperative Follow-up Patient presents post op from [facility] BSO for ovarian cancer risk, 2 weeks ago. Pathology A. UTERUS WITH CERVIX, BILATERAL FALLOPIAN TUBES AND OVARIES;  HYSTERECTOMY AND BILATERAL SALPINGO-OOPHORECTOMY:  - BENIGN ECTOCERVICAL AND ENDOCERVICAL MUCOSA.  - BENIGN WEAKLY PROLIFERATIVE ENDOMETRIUM.  - INTRAMURAL AND SUBSEROSAL LEIOMYOMATA (5), 2 WITH STROMAL  HYALINIZATION AND CALCIFICATIONS.  - BILATERAL BENIGN OVARIES AND FALLOPIAN TUBES.  Imaging:    Subjective: Patient reports some improvement in her post op symptoms of RLQ pain she has been having; CT neg last week for any additional etiology to her pain.  It is a sharp and burning pain deep to the inc on the right side.  Eating a regular diet without difficulty. Pain is controlled with current analgesics. Medications being used: ibuprofen (OTC) and narcotic analgesics including Percocet.  Activity: sedentary. Patient reports additional symptom's since surgery of None.  Objective: BP 120/80   Ht 5\' 4"  (1.626 m)   Wt 184 lb (83.5 kg)   LMP  (LMP Unknown) Comment: Possible Pre-Menopausal  BMI 31.58 kg/m  Physical Exam Constitutional:      General: She is not in acute distress.    Appearance: She is well-developed.  Cardiovascular:     Rate and Rhythm: Normal rate.  Pulmonary:     Effort: Pulmonary effort is normal.  Abdominal:     General: There is no distension.     Palpations: Abdomen is soft.     Tenderness: There is no abdominal tenderness.     Comments: Incision Healing Well   Musculoskeletal: Normal range of motion.  Neurological:     Mental Status: She is alert and oriented to person, place, and time.     Cranial Nerves: No cranial nerve deficit.  Skin:    General: Skin is warm and dry.   Assessment: s/p :  [facility] BSO progressing well  Plan: Patient has done well after surgery with no apparent complications.  I have discussed the post-operative course to date, and the expected progress moving  forward.  The patient understands what complications to be concerned about.  I will see the patient in routine follow up, or sooner if needed.    Activity plan: No heavy lifting.  Pelvic rest. Consider Gabapentin for nerve pain as likely etiology for her pain Recheck next month for vag healing and for pain RTW half days next weeek, note given  Hoyt Koch 05/21/2018, 8:42 AM

## 2018-05-24 ENCOUNTER — Ambulatory Visit: Payer: Managed Care, Other (non HMO)

## 2018-05-31 ENCOUNTER — Ambulatory Visit: Payer: Managed Care, Other (non HMO)

## 2018-06-07 ENCOUNTER — Ambulatory Visit: Payer: Managed Care, Other (non HMO)

## 2018-06-14 ENCOUNTER — Ambulatory Visit: Payer: Managed Care, Other (non HMO)

## 2018-06-16 ENCOUNTER — Ambulatory Visit: Payer: Managed Care, Other (non HMO) | Admitting: Family Medicine

## 2018-06-16 ENCOUNTER — Telehealth: Payer: Self-pay | Admitting: Urology

## 2018-06-16 DIAGNOSIS — R300 Dysuria: Secondary | ICD-10-CM

## 2018-06-16 NOTE — Telephone Encounter (Signed)
Pt called office stating she's having UTI symptoms, including urgency, frequency, bladder spasms and odor.  She was added to the nurse schedule today.

## 2018-06-16 NOTE — Progress Notes (Signed)
We were unable to run urine for patient today. Patient states she took a left over ABX for the last 2 days. The urine would not be accurate for infection. I spoke to [facility] about this and he states she could leave a urine sample in 3 days if she does not take anymore ABX. Patient voiced understanding.

## 2018-06-18 ENCOUNTER — Encounter: Payer: Self-pay | Admitting: Obstetrics & Gynecology

## 2018-06-18 ENCOUNTER — Ambulatory Visit (INDEPENDENT_AMBULATORY_CARE_PROVIDER_SITE_OTHER): Payer: Managed Care, Other (non HMO) | Admitting: Obstetrics & Gynecology

## 2018-06-18 VITALS — BP 118/80 | Ht 64.0 in | Wt 188.0 lb

## 2018-06-18 DIAGNOSIS — Z90710 Acquired absence of both cervix and uterus: Secondary | ICD-10-CM

## 2018-06-18 NOTE — Patient Instructions (Signed)
Primary Care in the area  This is not meant to be comprehensive list, but a resource for some providers that you can call for counseling or medication needs. If you have a recommendation, please let us know.   [facility]:  [phone]               Dr Miguel Aschoff               Dr Juanetta Beets               Dr Lavon Paganini  [facility]:  [phone] Dr Enid Derry      Dr Steele Sizer

## 2018-06-18 NOTE — Progress Notes (Signed)
  Postoperative Follow-up Patient presents post op from [place] BSO for [place] , 6 weeks ago.  Subjective: Patient reports marked improvement in her preop as well as post op pain symptoms. Eating a regular diet without difficulty. She has min RLQ inc type pain.  Activity: normal activities of daily living. Patient reports additional symptom's since surgery of no vaginal bleeding.  Objective: BP 118/80   Ht 5\' 4"  (1.626 m)   Wt 188 lb (85.3 kg)   LMP  (LMP Unknown) Comment: Possible Pre-Menopausal  BMI 32.27 kg/m  Physical Exam Constitutional:      General: She is not in acute distress.    Appearance: She is well-developed.  Genitourinary:     Pelvic exam was performed with patient supine.     Vagina and rectum normal.     No vaginal erythema or bleeding.     No right or left adnexal mass present.     Right adnexa not tender.     Left adnexa not tender.     Genitourinary Comments: Cervix and uterus absent. Vaginal cuff healing well.  Cardiovascular:     Rate and Rhythm: Normal rate.  Pulmonary:     Effort: Pulmonary effort is normal.  Abdominal:     General: There is no distension.     Palpations: Abdomen is soft.     Tenderness: There is no abdominal tenderness.     Comments: Incision healing well.  Musculoskeletal: Normal range of motion.  Neurological:     Mental Status: She is alert and oriented to person, place, and time.     Cranial Nerves: No cranial nerve deficit.  Skin:    General: Skin is warm and dry.   Assessment: s/p :  [facility] BSO for FIBROID UTERUS,GENE RISK FOR OVARIAN CANCER,FAMILY HISTORY FOR OVARIAN CANCER  progressing well  Plan: Patient has done well after surgery with no apparent complications.  I have discussed the post-operative course to date, and the expected progress moving forward.  The patient understands what complications to be concerned about.  I will see the patient in routine  follow up, or sooner if needed.    Activity plan: No restriction. MMG due in summer (also sees Dr Fleet Contras for breast follow up)  Hoyt Koch 06/18/2018, 8:23 AM

## 2018-06-18 NOTE — Addendum Note (Signed)
Addended by: Gae Dry on: 06/18/2018 08:41 AM   Modules accepted: Orders

## 2018-06-21 ENCOUNTER — Ambulatory Visit: Payer: Managed Care, Other (non HMO)

## 2018-08-16 ENCOUNTER — Ambulatory Visit: Payer: Managed Care, Other (non HMO) | Admitting: Obstetrics & Gynecology

## 2018-09-20 ENCOUNTER — Other Ambulatory Visit: Payer: Self-pay

## 2018-09-20 ENCOUNTER — Ambulatory Visit (INDEPENDENT_AMBULATORY_CARE_PROVIDER_SITE_OTHER): Payer: Managed Care, Other (non HMO) | Admitting: Obstetrics & Gynecology

## 2018-09-20 ENCOUNTER — Encounter: Payer: Self-pay | Admitting: Obstetrics & Gynecology

## 2018-09-20 VITALS — BP 120/80 | Ht 64.0 in | Wt 183.0 lb

## 2018-09-20 DIAGNOSIS — R1031 Right lower quadrant pain: Secondary | ICD-10-CM

## 2018-09-20 DIAGNOSIS — Z1239 Encounter for other screening for malignant neoplasm of breast: Secondary | ICD-10-CM

## 2018-09-20 DIAGNOSIS — Z01419 Encounter for gynecological examination (general) (routine) without abnormal findings: Secondary | ICD-10-CM | POA: Diagnosis not present

## 2018-09-20 DIAGNOSIS — Z0000 Encounter for general adult medical examination without abnormal findings: Secondary | ICD-10-CM

## 2018-09-20 DIAGNOSIS — Z1211 Encounter for screening for malignant neoplasm of colon: Secondary | ICD-10-CM

## 2018-09-20 NOTE — Progress Notes (Signed)
HPI:      Michelle Mejia is a 58 y.o. G1P1001 who LMP was in the past, she presents today for her annual examination.  The patient has no complaints today OTHER THAN continued intermittent sharp and pulling pain in lower right side since surgery in January, feels almost nerve like. No radiation, no difficulty with walking or motor function, no sensory deprivation, no GI or other associated findings The patient is sexually active. Herlast pap: approximate date 2018 and was normal and last mammogram: approximate date 2019 and was normal.  The patient does perform self breast exams.  There is notable family history of breast or ovarian cancer in her family- POS HEREDITARY GENE; s/p [facility] BSO. The patient is not taking hormone replacement therapy. Patient denies post-menopausal vaginal bleeding.   The patient has regular exercise: yes. The patient denies current symptoms of depression.    GYN Hx: Last Colonoscopy:5 years ago. Normal.  Last DEXA: never ago.    PMHx: Past Medical History:  Diagnosis Date  . Abnormal Pap smear of cervix   . Arthritis    hips   . BRCA negative 02/04/2012   Sample completed by [facility]. Performing laboratory: [facility]. Assession #:[id number] LD  . Cystitis   . Family history of adverse reaction to anesthesia    pts sisters bp drops with epi- pt collapsed after colonoscopy  . Family history of breast cancer   . GERD (gastroesophageal reflux disease)    occ-tums prn  . Headache    h/o migraines  . History of kidney stones    h/o  . Increased risk of breast cancer 2013, 2019   riskscore=28.6% due to [place] (not BRIP1 mutation)  . 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 APPENDECTOMY    . BREAST BIOPSY Right 07/06/2001   Stereotactic biopsy showing fibrocystic changes, apocrine metaplasia and ductal adenosis, ductal epithelial hyperplasia without  atypia.  Marland Kitchen BREAST BIOPSY Right 11/10/2016   benign, dicordant per radiologist  . BREAST CYST ASPIRATION Left 1994  . COLONOSCOPY N/A 04/02/2015   Procedure: COLONOSCOPY;  Surgeon: Manya Silvas, MD;  Location: [facility] ENDOSCOPY;  Service: Endoscopy;  Laterality: N/A;  . CYSTOSCOPY N/A 05/06/2018   Procedure: CYSTOSCOPY;  Surgeon: Gae Dry, MD;  Location: [facility] ORS;  Service: Gynecology;  Laterality: N/A;  . TONSILLECTOMY     Family History  Problem Relation Age of Onset  . Breast cancer Mother 75  . Bone cancer Mother   . Diabetes Mother   . Breast cancer Maternal Aunt   . Ovarian cancer Maternal Aunt   . Heart disease Father   . Thyroid disease Father   . Colon polyps Sister   . Cancer Sister    Social History   Tobacco Use  . Smoking status: Never Smoker  . Smokeless tobacco: Never Used  Substance Use Topics  . Alcohol use: No    Alcohol/week: 0.0 standard drinks  . Drug use: No    Current Outpatient Medications:  .  calcium carbonate (TUMS - DOSED IN MG ELEMENTAL CALCIUM) 500 MG chewable tablet, Chew 1 tablet by mouth as needed for indigestion or heartburn., Disp: , Rfl:  .  ibuprofen (ADVIL,MOTRIN) 200 MG tablet, Take 600 mg by mouth every 6 (six) hours as needed for headache or moderate pain., Disp: , Rfl:  .  meloxicam (MOBIC) 15 MG tablet, Take 15 mg  by mouth daily as needed for pain. , Disp: , Rfl:  .  Multiple Vitamins-Calcium (ONE-A-DAY WOMENS PO), Take 1 tablet by mouth daily., Disp: , Rfl:  .  naproxen sodium (ALEVE) 220 MG tablet, Take 220 mg by mouth daily as needed (for pain or headache)., Disp: , Rfl:  Allergies: Doxycycline; Phenergan [promethazine hcl]; Tramadol; Keflex [cephalexin]; Ketek [telithromycin]; Levaquin [levofloxacin in d5w]; Nitrofurantoin monohyd macro; Other; Oxycodone; and Parafon forte dsc [chlorzoxazone]  Review of Systems  Constitutional: Negative for chills, fever and malaise/fatigue.  HENT: Negative for congestion, sinus pain and  sore throat.   Eyes: Negative for blurred vision and pain.  Respiratory: Negative for cough and wheezing.   Cardiovascular: Negative for chest pain and leg swelling.  Gastrointestinal: Negative for abdominal pain, constipation, diarrhea, heartburn, nausea and vomiting.  Genitourinary: Negative for dysuria, frequency, hematuria and urgency.  Musculoskeletal: Negative for back pain, joint pain, myalgias and neck pain.  Skin: Negative for itching and rash.  Neurological: Negative for dizziness, tremors and weakness.  Endo/Heme/Allergies: Does not bruise/bleed easily.  Psychiatric/Behavioral: Negative for depression. The patient is not nervous/anxious and does not have insomnia.     Objective: BP 120/80   Ht '5\' 4"'$  (1.626 m)   Wt 183 lb (83 kg)   LMP  (LMP Unknown) Comment: Possible Pre-Menopausal  BMI 31.41 kg/m   Filed Weights   09/20/18 0804  Weight: 183 lb (83 kg)   Body mass index is 31.41 kg/m. Physical Exam Constitutional:      General: She is not in acute distress.    Appearance: She is well-developed.  Genitourinary:     Pelvic exam was performed with patient supine.     Vagina and rectum normal.     No lesions in the vagina.     No vaginal bleeding.     No right or left adnexal mass present.     Right adnexa not tender.     Left adnexa not tender.     Genitourinary Comments: Absent Uterus Absent cervix Vaginal cuff well healed No mass palpated  HENT:     Head: Normocephalic and atraumatic. No laceration.     Right Ear: Hearing normal.     Left Ear: Hearing normal.     Mouth/Throat:     Pharynx: Uvula midline.  Eyes:     Pupils: Pupils are equal, round, and reactive to light.  Neck:     Musculoskeletal: Normal range of motion and neck supple.     Thyroid: No thyromegaly.  Cardiovascular:     Rate and Rhythm: Normal rate and regular rhythm.     Heart sounds: No murmur. No friction rub. No gallop.   Pulmonary:     Effort: Pulmonary effort is normal. No  respiratory distress.     Breath sounds: Normal breath sounds. No wheezing.  Chest:     Breasts:        Right: No mass, skin change or tenderness.        Left: No mass, skin change or tenderness.  Abdominal:     General: Bowel sounds are normal. There is no distension.     Palpations: Abdomen is soft.     Tenderness: There is no abdominal tenderness. There is no rebound.  Musculoskeletal: Normal range of motion.  Neurological:     Mental Status: She is alert and oriented to person, place, and time.     Cranial Nerves: No cranial nerve deficit.  Skin:    General: Skin is warm and  dry.  Psychiatric:        Judgment: Judgment normal.  Vitals signs reviewed.     Assessment: Annual Exam 1. Annual physical exam   2. Screen for colon cancer   3. Screening for breast cancer   4. RLQ abdominal pain     Plan:            1.  Cervical Screening-  Pap smear schedule reviewed with patient  2. Breast screening- Exam annually and mammogram scheduled  3. Colonoscopy every 10 years, Hemoccult testing after age 56  4. Labs last year, she has odne thru work  5. Counseling for hormonal therapy: none              6. FRAX - FRAX score for assessing the 10 year probability for fracture calculated and discussed today.  Based on age and score today, DEXA is not scheduled.  7. 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  Consult Neurology.  Consider also Gabapentin.  No sign that PT would help as no motor dysfunction.  8. Urinary incontinence, mild.  {resent for years.  Sees urology.    F/U  Return in about 1 year (around 09/20/2019) for Annual.  Barnett Applebaum, MD, [facility], [facility] 09/20/2018  8:25 AM

## 2018-09-20 NOTE — Patient Instructions (Signed)
Neuropathic Pain Neuropathic pain is pain caused by damage to the nerves that are responsible for certain sensations in your body (sensory nerves). The pain can be caused by:  Damage to the sensory nerves that send signals to your spinal cord and brain (peripheral nervous system).  Damage to the sensory nerves in your brain or spinal cord (central nervous system). Neuropathic pain can make you more sensitive to pain. Even a minor sensation can feel very painful. This is usually a long-term condition that can be difficult to treat. The type of pain differs from person to person. It may:  Start suddenly (acute), or it may develop slowly and last for a long time (chronic).  Come and go as damaged nerves heal, or it may stay at the same level for years.  Cause emotional distress, loss of sleep, and a lower quality of life. What are the causes? The most common cause of this condition is diabetes. Many other diseases and conditions can also cause neuropathic pain. Causes of neuropathic pain can be classified as:  Toxic. This is caused by medicines and chemicals. The most common cause of toxic neuropathic pain is damage from cancer treatments (chemotherapy).  Metabolic. This can be caused by: ? Diabetes. This is the most common disease that damages the nerves. ? Lack of vitamin B from long-term alcohol abuse.  Traumatic. Any injury that cuts, crushes, or stretches a nerve can cause damage and pain. A common example is feeling pain after losing an arm or leg (phantom limb pain).  Compression-related. If a sensory nerve gets trapped or compressed for a long period of time, the blood supply to the nerve can be cut off.  Vascular. Many blood vessel diseases can cause neuropathic pain by decreasing blood supply and oxygen to nerves.  Autoimmune. This type of pain results from diseases in which the body's defense system (immune system) mistakenly attacks sensory nerves. Examples of autoimmune diseases  that can cause neuropathic pain include lupus and multiple sclerosis.  Infectious. Many types of viral infections can damage sensory nerves and cause pain. Shingles infection is a common cause of this type of pain.  Inherited. Neuropathic pain can be a symptom of many diseases that are passed down through families (genetic). What increases the risk? You are more likely to develop this condition if:  You have diabetes.  You smoke.  You drink too much alcohol.  You are taking certain medicines, including medicines that kill cancer cells (chemotherapy) or that treat immune system disorders. What are the signs or symptoms? The main symptom is pain. Neuropathic pain is often described as:  Burning.  Shock-like.  Stinging.  Hot or cold.  Itching. How is this diagnosed? No single test can diagnose neuropathic pain. It is diagnosed based on:  Physical exam and your symptoms. Your health care provider will ask you about your pain. You may be asked to use a pain scale to describe how bad your pain is.  Tests. These may be done to see if you have a high sensitivity to pain and to help find the cause and location of any sensory nerve damage. They include: ? Nerve conduction studies to test how well nerve signals travel through your sensory nerves (electrodiagnostic testing). ? Stimulating your sensory nerves through electrodes on your skin and measuring the response in your spinal cord and brain (somatosensory evoked potential).  Imaging studies, such as: ? X-rays. ? CT scan. ? MRI. How is this treated? Treatment for neuropathic pain may change   over time. You may need to try different treatment options or a combination of treatments. Some options include:  Treating the underlying cause of the neuropathy, such as diabetes, kidney disease, or vitamin deficiencies.  Stopping medicines that can cause neuropathy, such as chemotherapy.  Medicine to relieve pain. Medicines may include: ?  Prescription or over-the-counter pain medicine. ? Anti-seizure medicine. ? Antidepressant medicines. ? Pain-relieving patches that are applied to painful areas of skin. ? A medicine to numb the area (local anesthetic), which can be injected as a nerve block.  Transcutaneous nerve stimulation. This uses electrical currents to block painful nerve signals. The treatment is painless.  Alternative treatments, such as: ? Acupuncture. ? Meditation. ? Massage. ? Physical therapy. ? Pain management programs. ? Counseling. Follow these instructions at home: Medicines   Take over-the-counter and prescription medicines only as told by your health care provider.  Do not drive or use heavy machinery while taking prescription pain medicine.  If you are taking prescription pain medicine, take actions to prevent or treat constipation. Your health care provider may recommend that you: ? Drink enough fluid to keep your urine pale yellow. ? Eat foods that are high in fiber, such as fresh fruits and vegetables, whole grains, and beans. ? Limit foods that are high in fat and processed sugars, such as fried or sweet foods. ? Take an over-the-counter or prescription medicine for constipation. Lifestyle   Have a good support system at home.  Consider joining a chronic pain support group.  Do not use any products that contain nicotine or tobacco, such as cigarettes and e-cigarettes. If you need help quitting, ask your health care provider.  Do not drink alcohol. General instructions  Learn as much as you can about your condition.  Work closely with all your health care providers to find the treatment plan that works best for you.  Ask your health care provider what activities are safe for you.  Keep all follow-up visits as told by your health care provider. This is important. Contact a health care provider if:  Your pain treatments are not working.  You are having side effects from your  medicines.  You are struggling with tiredness (fatigue), mood changes, depression, or anxiety. Summary  Neuropathic pain is pain caused by damage to the nerves that are responsible for certain sensations in your body (sensory nerves).  Neuropathic pain may come and go as damaged nerves heal, or it may stay at the same level for years.  Neuropathic pain is usually a long-term condition that can be difficult to treat. Consider joining a chronic pain support group. This information is not intended to replace advice given to you by your health care provider. Make sure you discuss any questions you have with your health care provider. Document Released: 12/27/2003 Document Revised: 04/17/2017 Document Reviewed: 04/17/2017 Elsevier Interactive Patient Education  2019 Elsevier Inc.  

## 2018-10-11 ENCOUNTER — Other Ambulatory Visit: Payer: Self-pay | Admitting: *Deleted

## 2018-10-11 DIAGNOSIS — N6489 Other specified disorders of breast: Secondary | ICD-10-CM

## 2018-11-01 ENCOUNTER — Encounter: Payer: Self-pay | Admitting: General Surgery

## 2018-11-03 ENCOUNTER — Encounter: Payer: Self-pay | Admitting: Adult Health

## 2018-11-03 ENCOUNTER — Ambulatory Visit: Payer: Managed Care, Other (non HMO) | Admitting: Adult Health

## 2018-11-03 ENCOUNTER — Other Ambulatory Visit: Payer: Self-pay

## 2018-11-03 VITALS — BP 108/62 | HR 87 | Temp 98.1°F | Resp 18 | Wt 184.0 lb

## 2018-11-03 DIAGNOSIS — Z0189 Encounter for other specified special examinations: Secondary | ICD-10-CM | POA: Diagnosis not present

## 2018-11-03 DIAGNOSIS — Z008 Encounter for other general examination: Secondary | ICD-10-CM

## 2018-11-03 NOTE — Progress Notes (Signed)
Biometric screening form only. She had full physical with [facility]. Declines provider exam.

## 2018-11-04 ENCOUNTER — Encounter: Payer: Self-pay | Admitting: Adult Health

## 2018-11-04 LAB — LIPID PANEL WITH LDL/HDL RATIO
Cholesterol, Total: 196 mg/dL (ref 100–199)
HDL: 54 mg/dL (ref >39)
LDL Calculated: 123 mg/dL — ABNORMAL HIGH (ref 0–99)
LDl/HDL Ratio: 2.3 ratio (ref 0.0–3.2)
Triglycerides: 93 mg/dL (ref 0–149)
VLDL Cholesterol Cal: 19 mg/dL (ref 5–40)

## 2018-11-04 LAB — GLUCOSE, RANDOM: Glucose: 96 mg/dL (ref 65–99)

## 2018-11-14 NOTE — Progress Notes (Signed)
Virtual Visit via Video Note The purpose of this virtual visit is to provide medical care while limiting exposure to the novel coronavirus.    Consent was obtained for video visit:  Yes Answered questions that patient had about telehealth interaction:  Yes I discussed the limitations, risks, security and privacy concerns of performing an evaluation and management service by telemedicine. I also discussed with the patient that there may be a patient responsible charge related to this service. The patient expressed understanding and agreed to proceed.  Pt location: Home Physician Location: Home Name of referring provider:  Gae Dry, MD I connected with Michelle Mejia at patients initiation/request on 11/15/2018 at  9:10 AM EDT by video enabled telemedicine application and verified that I am speaking with the correct person using two identifiers. Pt MRN:  [id number] Pt DOB:  [birth year] Video Participants:  Michelle Mejia   History of Present Illness:  Michelle Mejia is a 58 year old female who presents for RLQ abdominal pain.  History supplemented by referring provider notes.  Following total laparoscopic hysterectomy in January for uterine fibroids, she developed immediately waking up from surgery.  It is an occasional severe stabbing pain in the RLQ, lasting a few minutes.  It was initially more frequent but improved over past few weeks.  They occur once a day to once every other day.  No burning sensation.  She notes tingling and numbness on the skin.  She is tender to touch on one of the incision sites.  No changes in bowel and bladder function.  However, she has a history of recurrent bladder infections anyway.  No back or radicular pain down the legs.    She had a CT abdomen and pelvis a few days later on 05/11/18, which was personally reviewed and demonstrated no postoperative complications or any secondary findings to explain pain.    She takes meloxicam once a week for joint pain  and occasional ibuprofen but the pain doesn't really last long enough to require abortive pain medication.    Past Medical History: Past Medical History:  Diagnosis Date  . Abnormal Pap smear of cervix   . Arthritis    hips   . BRCA negative 02/04/2012   Sample completed by [facility]. Performing laboratory: [facility]. Assession #:[id number] LD  . Cystitis   . Family history of adverse reaction to anesthesia    pts sisters bp drops with epi- pt collapsed after colonoscopy  . Family history of breast cancer   . GERD (gastroesophageal reflux disease)    occ-tums prn  . Headache    h/o migraines  . History of kidney stones    h/o  . Increased risk of breast cancer 2013, 2019   riskscore=28.6% due to [place] (not BRIP1 mutation)  . 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 calcium carbonate (TUMS - DOSED IN MG ELEMENTAL CALCIUM) 500 MG chewable tablet Chew 1 tablet by mouth as needed for indigestion or heartburn.  Marland Kitchen ibuprofen (ADVIL,MOTRIN) 200 MG tablet Take 600 mg by mouth every 6 (six) hours as needed for headache or moderate pain.  . meloxicam (MOBIC) 15 MG tablet Take 15 mg by mouth daily as needed for pain.   . Multiple Vitamins-Calcium (ONE-A-DAY WOMENS PO) Take 1 tablet by mouth daily.  . naproxen sodium (ALEVE) 220 MG tablet Take 220 mg by  mouth daily as needed (for pain or headache).   No facility-administered encounter medications on file as of 11/15/2018.     Allergies: Allergies  Allergen Reactions  . Doxycycline Nausea And Vomiting and Other (See Comments)    dizzy  . Phenergan [Promethazine Hcl] Nausea And Vomiting  . Tramadol Nausea And Vomiting and Other (See Comments)    dizzy  . Keflex [Cephalexin] Rash  . Ketek [Telithromycin] Itching and Rash  . Levaquin [Levofloxacin In D5w] Rash  . Nitrofurantoin Monohyd Macro Rash   . Other Rash    Muscle relaxer medication but not flexeril or robaxin pt unsure of the name.   . Oxycodone Rash  . Parafon Forte Dsc [Chlorzoxazone] Rash    Family History: Family History  Problem Relation Age of Onset  . Breast cancer Mother 12  . Bone cancer Mother   . Diabetes Mother   . Breast cancer Maternal Aunt   . Ovarian cancer Maternal Aunt   . Heart disease Father   . Thyroid disease Father   . Colon polyps Sister   . Cancer Sister     Social History: Social History   Socioeconomic History  . Marital status: Married    Spouse name: Not on file  . Number of children: 1  . Years of education: Not on file  . Highest education level: Not on file  Occupational History  . Not on file  Social Needs  . Financial resource strain: Not on file  . Food insecurity    Worry: Not on file    Inability: Not on file  . Transportation needs    Medical: Not on file    Non-medical: Not on file  Tobacco Use  . Smoking status: Never Smoker  . Smokeless tobacco: Never Used  Substance and Sexual Activity  . Alcohol use: No    Alcohol/week: 0.0 standard drinks  . Drug use: No  . Sexual activity: Yes    Birth control/protection: None  Lifestyle  . Physical activity    Days per week: Not on file    Minutes per session: Not on file  . Stress: Not on file  Relationships  . Social Herbalist on phone: Not on file    Gets together: Not on file    Attends religious service: Not on file    Active member of club or organization: Not on file    Attends meetings of clubs or organizations: Not on file    Relationship status: Not on file  . Intimate partner violence    Fear of current or ex partner: Not on file    Emotionally abused: Not on file    Physically abused: Not on file    Forced sexual activity: Not on file  Other Topics Concern  . Not on file  Social History Narrative  . Not on file    Observations/Objective:   Height '5\' 4"'$  (1.626 m), weight 183 lb  (83 kg). No acute distress.  Alert and oriented.  Speech fluent and not dysarthric.  Language intact.  Eyes orthophoric on primary gaze.  Face symmetric.  Assessment and Plan:   Neuralgia.  I agree with Dr. Doreene Adas assessment that a nerve was likely irritated during the procedure.  CT revealed no structural etiology (such as hematoma).  She doesn't endorse any lumbosacral radicular symptoms.    At this point, she would like to avoid treating pain with medication as there has been some improvement.  She want's to first  see how much it will continue to improve.  If she changes her mind about treating it (I recommended gabapentin), she will contact us.  Follow Up Instructions:    -I discussed the assessment and treatment plan with the patient. The patient was provided an opportunity to ask questions and all were answered. The patient agreed with the plan and demonstrated an understanding of the instructions.   The patient was advised to call back or seek an in-person evaluation if the symptoms worsen or if the condition fails to improve as anticipated.   Dudley Major, DO

## 2018-11-15 ENCOUNTER — Encounter: Payer: Self-pay | Admitting: Neurology

## 2018-11-15 ENCOUNTER — Other Ambulatory Visit: Payer: Self-pay

## 2018-11-15 ENCOUNTER — Telehealth (INDEPENDENT_AMBULATORY_CARE_PROVIDER_SITE_OTHER): Payer: Managed Care, Other (non HMO) | Admitting: Neurology

## 2018-11-15 VITALS — Ht 64.0 in | Wt 183.0 lb

## 2018-11-15 DIAGNOSIS — M792 Neuralgia and neuritis, unspecified: Secondary | ICD-10-CM

## 2018-11-25 ENCOUNTER — Ambulatory Visit
Admission: RE | Admit: 2018-11-25 | Discharge: 2018-11-25 | Disposition: A | Discharge status: Home / Self Care | Payer: Managed Care, Other (non HMO) | Source: Ambulatory Visit | Attending: General Surgery | Admitting: General Surgery

## 2018-11-25 ENCOUNTER — Other Ambulatory Visit: Payer: Self-pay | Admitting: *Deleted

## 2018-11-25 ENCOUNTER — Other Ambulatory Visit: Payer: Managed Care, Other (non HMO)

## 2018-11-25 ENCOUNTER — Telehealth: Payer: Self-pay | Admitting: *Deleted

## 2018-11-25 DIAGNOSIS — N6489 Other specified disorders of breast: Secondary | ICD-10-CM | POA: Diagnosis present

## 2018-11-25 DIAGNOSIS — R928 Other abnormal and inconclusive findings on diagnostic imaging of breast: Secondary | ICD-10-CM

## 2018-11-25 DIAGNOSIS — Z9189 Other specified personal risk factors, not elsewhere classified: Secondary | ICD-10-CM

## 2018-11-25 DIAGNOSIS — R933 Abnormal findings on diagnostic imaging of other parts of digestive tract: Secondary | ICD-10-CM

## 2018-11-25 NOTE — Telephone Encounter (Signed)
Per Dr Dahlia Byes I notified patient that the mammogram was abnormal and a bilateral breast MRI was recommended. Order was placed and she is aware that this needs to be completed prior to her appointment with him on 12-01-18, pt agrees.

## 2018-11-29 ENCOUNTER — Other Ambulatory Visit: Payer: Self-pay

## 2018-11-29 ENCOUNTER — Ambulatory Visit
Admission: RE | Admit: 2018-11-29 | Discharge: 2018-11-29 | Disposition: A | Discharge status: Home / Self Care | Payer: Managed Care, Other (non HMO) | Source: Ambulatory Visit | Attending: Surgery | Admitting: Surgery

## 2018-11-29 DIAGNOSIS — R928 Other abnormal and inconclusive findings on diagnostic imaging of breast: Secondary | ICD-10-CM | POA: Diagnosis present

## 2018-11-29 DIAGNOSIS — Z9189 Other specified personal risk factors, not elsewhere classified: Secondary | ICD-10-CM | POA: Diagnosis present

## 2018-11-29 MED ORDER — GADOBUTROL 1 MMOL/ML IV SOLN
8.0000 mL | Freq: Once | INTRAVENOUS | Status: AC | PRN
  Administered 2018-11-29: 8 mL via INTRAVENOUS

## 2018-11-29 NOTE — Addendum Note (Signed)
Addended by: Dominga Ferry on: 11/29/2018 09:32 AM   Modules accepted: Orders

## 2018-12-01 ENCOUNTER — Ambulatory Visit: Payer: Managed Care, Other (non HMO) | Admitting: Surgery

## 2018-12-01 ENCOUNTER — Other Ambulatory Visit: Payer: Self-pay | Admitting: Surgery

## 2018-12-01 ENCOUNTER — Other Ambulatory Visit: Payer: Self-pay

## 2018-12-01 ENCOUNTER — Encounter: Payer: Self-pay | Admitting: Surgery

## 2018-12-01 VITALS — BP 124/79 | HR 93 | Temp 97.2°F | Ht 64.0 in | Wt 182.0 lb

## 2018-12-01 DIAGNOSIS — N6489 Other specified disorders of breast: Secondary | ICD-10-CM | POA: Diagnosis not present

## 2018-12-01 DIAGNOSIS — R9389 Abnormal findings on diagnostic imaging of other specified body structures: Secondary | ICD-10-CM

## 2018-12-01 NOTE — Progress Notes (Signed)
Patient to have MRI right breast biopsy .appt time. 12/08/2018 at 7:00 am

## 2018-12-01 NOTE — Patient Instructions (Addendum)
Patient to have MRI right breast biopsy . We will call you with appt time. 12/08/2018 at 7:00 am.  Laramie wendover avd.

## 2018-12-02 ENCOUNTER — Ambulatory Visit: Payer: Managed Care, Other (non HMO) | Admitting: General Surgery

## 2018-12-03 NOTE — Progress Notes (Signed)
Outpatient Surgical Follow Up  12/03/2018  Michelle Mejia is an 58 y.o. female.   Chief Complaint  Patient presents with  . 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  She was followed by Dr. Bary Castilla previously but never had a definitive excisional biopsy of the abnormal lesion.  Please note that I have personally reviewed the mammogram  ultrasound  aND mri ; there is evidence of an asymmetry  And distortion on the right breast, there are actually two abnormal areas.  He has had a previous biopsy showing evidence of intraductal papilloma and sclerosing adenosis as well as a stromal fibrosis.  He does have a very strong history of breast cancer in her mother and 2 maternal aunts.  Did have a prophylactic BSO and hysterectomy due to a gene mutation that increases her risk for ovarian cancer.  She was BRCA negative. She denies any fevers any chills no evidence of breast mass.  No evidence of nipple discharge or skin changes within the breast.  No weight loss.    Past Medical History:  Diagnosis Date  . Abnormal Pap smear of cervix   . Arthritis    hips   . BRCA negative 02/04/2012   Sample completed by [facility]. Performing laboratory: [facility]. Assession #:[id number] LD  . Cystitis   . Family history of adverse reaction to anesthesia    pts sisters bp drops with epi- pt collapsed after colonoscopy  . Family history of breast cancer   . GERD (gastroesophageal reflux disease)    occ-tums prn  . Headache    h/o migraines  . History of kidney stones    h/o  . Increased risk of breast cancer 2013, 2019   riskscore=28.6% due to [place] (not BRIP1 mutation)  . 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 APPENDECTOMY    . BREAST BIOPSY Right 07/06/2001   Stereotactic biopsy showing fibrocystic changes, apocrine metaplasia and ductal adenosis, ductal epithelial hyperplasia without atypia.  Marland Kitchen BREAST BIOPSY Right 11/10/2016   A. sclerosing adenosis, dicordant per radiologist  . BREAST CYST ASPIRATION Left 1994  . COLONOSCOPY N/A 04/02/2015   Procedure: COLONOSCOPY;  Surgeon: Manya Silvas, MD;  Location: [facility] ENDOSCOPY;  Service: Endoscopy;  Laterality: N/A;  . CYSTOSCOPY N/A 05/06/2018   Procedure: CYSTOSCOPY;  Surgeon: Gae Dry, MD;  Location: [facility] ORS;  Service: Gynecology;  Laterality: N/A;  . TONSILLECTOMY      Family History  Problem Relation Age of Onset  . Breast cancer Mother 66  . Bone cancer Mother   . Diabetes Mother   . Breast cancer Maternal Aunt        post menopausal   . Heart disease Father   . Thyroid disease Father   . Colon polyps Sister   . Cancer Sister   . Breast cancer Maternal Aunt        post menopausal   . Ovarian cancer Maternal Aunt        post menopausal     Social History:  reports that she has never smoked. She has never used smokeless tobacco. She reports that she does not drink alcohol or use drugs.  Allergies:  Allergies  Allergen Reactions  . Doxycycline Nausea And Vomiting and Other (  See Comments)    dizzy  . Phenergan [Promethazine Hcl] Nausea And Vomiting  . Tramadol Nausea And Vomiting and Other (See Comments)    dizzy  . Keflex [Cephalexin] Rash  . Ketek [Telithromycin] Itching and Rash  . Levaquin [Levofloxacin In D5w] Rash  . Nitrofurantoin Monohyd Macro Rash  . Other Rash    Muscle relaxer medication but not flexeril or robaxin pt unsure of the name.   . Oxycodone Rash  . Parafon Forte Dsc [Chlorzoxazone] Rash    Medications reviewed.    ROS Full ROS performed and is otherwise negative other than what is stated in HPI   BP 124/79   Pulse 93   Temp (!) 97.2 F (36.2 C)   Ht '5\' 4"'  (1.626 m)   Wt 182 lb (82.6 kg)   LMP  (LMP Unknown) Comment: pt negative for  BRCA Gene for breast ca but positive ovarian ca. Complete hysterectomy done 04/2018  SpO2 96%   BMI 31.24 kg/m   Physical Exam Vitals signs and nursing note reviewed. Exam conducted with a chaperone present.  Constitutional:      Appearance: Normal appearance. She is normal weight.  Eyes:     General: No scleral icterus.       Right eye: No discharge.        Left eye: No discharge.  Neck:     Musculoskeletal: Normal range of motion and neck supple. No neck rigidity or muscular tenderness.  Cardiovascular:     Rate and Rhythm: Normal rate and regular rhythm.  Pulmonary:     Effort: Pulmonary effort is normal.     Breath sounds: Normal breath sounds. No stridor.     Comments: BREAST; No obvious masses, no LAD, nml nipple and skin. No discharge Abdominal:     General: Abdomen is flat. There is no distension.     Palpations: Abdomen is soft. There is no mass.     Tenderness: There is no abdominal tenderness. There is no guarding.     Hernia: No hernia is present.  Musculoskeletal: Normal range of motion.  Skin:    General: Skin is warm and dry.     Capillary Refill: Capillary refill takes less than 2 seconds.  Neurological:     General: No focal deficit present.     Mental Status: She is alert and oriented to person, place, and time.  Psychiatric:        Mood and Affect: Mood normal.        Behavior: Behavior normal.        Thought Content: Thought content normal.        Judgment: Judgment normal.        Assessment/Plan: 58 year old female with a history of asymmetry of the right breast, she is definitely high risk for developing breast cancer.  Given increased risk factors and asymmetry I do recommend proceeding with MRI guided core needle biopsy of the two concerning spots on the Right breast.  Discussed with the patient in detail and she understands.  I will see her back after the results are finalized. Greater than 50% of the 40 minutes  visit was spent in  counseling/coordination of care   Caroleen Hamman, MD [place] Surgeon

## 2018-12-08 ENCOUNTER — Ambulatory Visit
Admission: RE | Admit: 2018-12-08 | Discharge: 2018-12-08 | Disposition: A | Discharge status: Home / Self Care | Payer: Managed Care, Other (non HMO) | Source: Ambulatory Visit | Attending: Surgery | Admitting: Surgery

## 2018-12-08 ENCOUNTER — Other Ambulatory Visit: Payer: Self-pay

## 2018-12-08 ENCOUNTER — Ambulatory Visit
Admission: RE | Admit: 2018-12-08 | Discharge: 2018-12-08 | Disposition: A | Discharge status: Home / Self Care | Payer: Managed Care, Other (non HMO) | Source: Ambulatory Visit

## 2018-12-08 DIAGNOSIS — R9389 Abnormal findings on diagnostic imaging of other specified body structures: Secondary | ICD-10-CM

## 2018-12-08 HISTORY — PX: BREAST BIOPSY: SHX20

## 2018-12-08 MED ORDER — GADOBUTROL 1 MMOL/ML IV SOLN
7.0000 mL | Freq: Once | INTRAVENOUS | Status: AC | PRN
  Administered 2018-12-08: 7 mL via INTRAVENOUS

## 2018-12-22 ENCOUNTER — Other Ambulatory Visit: Payer: Self-pay

## 2018-12-22 ENCOUNTER — Ambulatory Visit (INDEPENDENT_AMBULATORY_CARE_PROVIDER_SITE_OTHER): Payer: Managed Care, Other (non HMO) | Admitting: Surgery

## 2018-12-22 ENCOUNTER — Encounter: Payer: Self-pay | Admitting: Surgery

## 2018-12-22 VITALS — BP 124/78 | HR 74 | Temp 97.7°F | Ht 64.0 in | Wt 189.0 lb

## 2018-12-22 DIAGNOSIS — N6489 Other specified disorders of breast: Secondary | ICD-10-CM | POA: Diagnosis not present

## 2018-12-22 NOTE — Patient Instructions (Signed)
Patient to be scheduled for right lumpectomy .

## 2018-12-22 NOTE — Progress Notes (Signed)
Outpatient Surgical Follow Up  12/22/2018  Michelle Mejia Mejia is an 58 y.o. female.   Chief Complaint  Patient presents with  . 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  She was followed by Michelle Mejia Mejia previously but never had a definitive excisional biopsy of the abnormal lesion. Please note that I have personally reviewed the mammogram  ultrasound  and MRI ; there is evidence of an asymmetry  And distortion on the right breast, there are actually two abnormal areas.   She did have a recent MRI bx showing evidence of COMPLEX SCLEROSING LESION   SHe has had a previous biopsy showing evidence of intraductal papilloma and sclerosing adenosis as well as a stromal fibrosis.   SHe does have a very strong history of breast cancer in her mother and 2 maternal aunts.  She Did have a prophylactic BSO and hysterectomy due to a gene mutation that increases her risk for ovarian cancer.  She was BRCA negative. Today I had an extensive discussion with the patient regarding next steps and observation versus definitive excision.  She wishes to undergo excision   Past Medical History:  Diagnosis Date  . Abnormal Pap smear of cervix   . Arthritis    hips   . BRCA negative 02/04/2012   Sample completed by [facility]. Performing laboratory: [facility]. Assession #:[id number] LD  . Cystitis   . Family history of adverse reaction to anesthesia    pts sisters bp drops with epi- pt collapsed after colonoscopy  . Family history of breast cancer   . GERD (gastroesophageal reflux disease)    occ-tums prn  . Headache    h/o migraines  . History of kidney stones    h/o  . Increased risk of breast cancer 2013, 2019   riskscore=28.6% due to [place] (not BRIP1 mutation)  . 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 APPENDECTOMY    . BREAST BIOPSY Right 07/06/2001   Stereotactic biopsy showing fibrocystic changes, apocrine metaplasia and ductal adenosis, ductal epithelial hyperplasia without atypia.  Marland Kitchen BREAST BIOPSY Right 11/10/2016   A. sclerosing adenosis, dicordant per radiologist  . BREAST CYST ASPIRATION Left 1994  . COLONOSCOPY N/A 04/02/2015   Procedure: COLONOSCOPY;  Surgeon: Michelle Silvas, MD;  Location: [facility] ENDOSCOPY;  Service: Endoscopy;  Laterality: N/A;  . CYSTOSCOPY N/A 05/06/2018   Procedure: CYSTOSCOPY;  Surgeon: Michelle Dry, MD;  Location: [facility] ORS;  Service: Gynecology;  Laterality: N/A;  . TONSILLECTOMY      Family History  Problem Relation Age of Onset  . Breast cancer Mother 37  . Bone cancer Mother   . Diabetes Mother   . Breast cancer Maternal Aunt        post menopausal   . Heart disease Father   . Thyroid disease Father   . Colon polyps Sister   . Cancer Sister   . Breast cancer Maternal Aunt        post menopausal   . Ovarian cancer Maternal Aunt        post menopausal     Social History:  reports that she has never smoked. She has never used smokeless tobacco. She reports that she does not drink alcohol or use drugs.  Allergies:  Allergies  Allergen Reactions  . Doxycycline Nausea And Vomiting and Other (See Comments)    dizzy  . Phenergan [Promethazine Hcl] Nausea And Vomiting  . Tramadol Nausea And Vomiting and Other (See Comments)    dizzy  . Keflex [Cephalexin] Rash  . Ketek [Telithromycin] Itching and Rash  . Levaquin [Levofloxacin In D5w] Rash  . Nitrofurantoin Monohyd Macro Rash  . Other Rash    Muscle relaxer medication but not flexeril or robaxin pt unsure of the name.   . Oxycodone Rash  . Parafon Forte Dsc [Chlorzoxazone] Rash    Medications reviewed.    ROS Full ROS performed and is otherwise negative other than what is stated in HPI   BP 124/78   Pulse 74   Temp 97.7 F (36.5 C) (Skin)    Ht _0  (1.626 m)   Wt 189 lb (85.7 kg)   LMP  (LMP Unknown) Comment: pt negative for BRCA Gene for breast ca but positive ovarian ca. Complete hysterectomy done 04/2018  SpO2 98%   BMI 32.44 kg/m   Physical Exam Vitals signs and nursing note reviewed. Exam conducted with a chaperone present.  Constitutional:      General: She is not in acute distress.    Appearance: Normal appearance. She is not ill-appearing.  Eyes:     General:        Right eye: No discharge.        Left eye: No discharge.     Extraocular Movements: Extraocular movements intact.  Neck:     Musculoskeletal: Normal range of motion and neck supple. No neck rigidity.  Cardiovascular:     Rate and Rhythm: Normal rate and regular rhythm.     Pulses: Normal pulses.     Heart sounds: No murmur.  Pulmonary:     Effort: Pulmonary effort is normal. No respiratory distress.     Breath sounds: Normal breath sounds. No stridor. No wheezing or rhonchi.     Comments: BREAST: no evidence of palpable masses, nipple and areola w/o lesions no LAD. THere is a slight asymmetry w the left breast being more pedunculated. Abdominal:     General: Abdomen is flat. There is no distension.     Palpations: There is no mass.     Tenderness: There is no abdominal tenderness. There is no guarding or rebound.     Hernia: No hernia is present.  Musculoskeletal: Normal range of motion.        General: No swelling or tenderness.  Skin:    General: Skin is warm and Mejia.     Capillary Refill: Capillary refill takes 2 to 3 seconds.     Coloration: Skin is not jaundiced.  Neurological:     General: No focal deficit present.     Mental Status: She is alert and oriented to person, place, and time.  Psychiatric:        Mood and Affect: Mood normal.        Behavior: Behavior normal.        Thought Content: Thought content normal.        Judgment: Judgment normal.         Assessment/Plan:  58 yo female with radial scar on 2 MRIs  abnormalities on the right breast.  There is evidence of acomplex sclerosing lesion.  I had an extensive discussion with the patient regarding her pathology.  She wishes to undergo excisional biopsy.  We will plan for needle guided lumpectomy.  Procedure discussed with  the patient in detail.  Risk benefits and possible complications including but not limited to: Bleeding, infection, deformity and cosmetic issues.  She understands and wishes to proceed.  Greater than 50% of the 40 minutes  visit was spent in counseling/coordination of care   Caroleen Hamman, MD [place] Surgeon

## 2018-12-23 ENCOUNTER — Other Ambulatory Visit: Payer: Self-pay

## 2018-12-23 ENCOUNTER — Other Ambulatory Visit: Payer: Self-pay | Admitting: Surgery

## 2018-12-23 DIAGNOSIS — R9389 Abnormal findings on diagnostic imaging of other specified body structures: Secondary | ICD-10-CM

## 2018-12-24 ENCOUNTER — Other Ambulatory Visit: Payer: Self-pay | Admitting: Obstetrics & Gynecology

## 2018-12-30 ENCOUNTER — Other Ambulatory Visit: Payer: Self-pay | Admitting: Surgery

## 2018-12-30 DIAGNOSIS — N6489 Other specified disorders of breast: Secondary | ICD-10-CM

## 2018-12-31 ENCOUNTER — Telehealth: Payer: Self-pay | Admitting: Surgery

## 2018-12-31 NOTE — Telephone Encounter (Signed)
Pt has been advised of pre admission date/time, Covid Testing date and Surgery date.  Surgery Date: 02/24/19 with Dr Pabon-lumpectomy with NL. Preadmission Testing Date: 02/14/19 between 8-1:00pm-phone interview.  Covid Testing Date: 02/21/19 between 8-10:30am - patient advised to go to the [facility]  [employer] Video sent via [employer] [facility] and [employer].  Patient has been made aware to arrive at [facility] at 7:45am the day of surgery.

## 2019-01-12 ENCOUNTER — Ambulatory Visit: Payer: Managed Care, Other (non HMO) | Admitting: Surgery

## 2019-02-08 LAB — FECAL OCCULT BLOOD, IMMUNOCHEMICAL

## 2019-02-14 ENCOUNTER — Encounter
Admission: RE | Admit: 2019-02-14 | Discharge: 2019-02-14 | Disposition: A | Discharge status: Home / Self Care | Payer: Managed Care, Other (non HMO) | Source: Ambulatory Visit | Attending: Surgery | Admitting: Surgery

## 2019-02-14 ENCOUNTER — Other Ambulatory Visit: Payer: Self-pay

## 2019-02-14 HISTORY — DX: Urinary tract infection, site not specified: N39.0

## 2019-02-14 NOTE — Patient Instructions (Addendum)
Your procedure is scheduled on: Thursday 02/24/19 Report to [place]. [place] [phone] .  Remember: Instructions that are not followed completely may result in serious medical risk, up to and including death, or upon the discretion of your surgeon and anesthesiologist your surgery may need to be rescheduled.     _X__ 1. Do not eat food after midnight the night before your procedure.                 No gum chewing or hard candies. You may drink clear liquids up to 2 hours                 before you are scheduled to arrive for your surgery- DO not drink clear                 liquids within 2 hours of the start of your surgery.                 Clear Liquids include:  water, apple juice without pulp, clear carbohydrate                 drink such as Clearfast or Gatorade, Black Coffee or Tea (Do not add                 anything to coffee or tea). Diabetics water only  __X__2.  On the morning of surgery brush your teeth with toothpaste and water, you                 may rinse your mouth with mouthwash if you wish.  Do not swallow any              toothpaste of mouthwash.     _X__ 3.  No Alcohol for 24 hours before or after surgery.   _X__ 4.  Do Not Smoke or use e-cigarettes For 24 Hours Prior to Your Surgery.                 Do not use any chewable tobacco products for at least 6 hours prior to                 surgery.  ____  5.  Bring all medications with you on the day of surgery if instructed.   __X__  6.  Notify your doctor if there is any change in your medical condition      (cold, fever, infections).     Do not wear jewelry, make-up, hairpins, clips or nail polish. Do not wear lotions, powders, or perfumes.  Do not shave 48 hours prior to surgery. Men may shave face and neck. Do not bring valuables to the hospital.    [facility] is not responsible for any belongings or valuables.  Contacts, dentures/partials or body  piercings may not be worn into surgery. Bring a case for your contacts, glasses or hearing aids, a denture cup will be supplied. Leave your suitcase in the car. After surgery it may be brought to your room. For patients admitted to the hospital, discharge time is determined by your treatment team.   Patients discharged the day of surgery will not be allowed to drive home.   Please read over the following fact sheets that you were given:   MRSA Information  __X__ Take these medicines the morning of surgery with A SIP OF WATER:    1.   2.   3.   4.  5.  6.  ____ Fleet Enema (as directed)   __X__ Use CHG Soap/SAGE wipes as directed  ____ Use inhalers on the day of surgery  ____ Stop metformin/Janumet/Farxiga 2 days prior to surgery    ____ Take 1/2 of usual insulin dose the night before surgery. No insulin the morning          of surgery.   ____ Stop Blood Thinners Coumadin/Plavix/Xarelto/Pleta/Pradaxa/Eliquis/Effient/Aspirin  on   Or contact your Surgeon, Cardiologist or Medical Doctor regarding  ability to stop your blood thinners  __X__ Stop Anti-inflammatories 7 days before surgery such as Advil, Ibuprofen, Motrin,  BC or Goodies Powder, Naprosyn, Naproxen, Aleve, Aspirin    __X__ Stop all herbal supplements, fish oil or vitamin E until after surgery.    ____ Bring C-Pap to the hospital.

## 2019-02-17 ENCOUNTER — Telehealth: Payer: Self-pay | Admitting: *Deleted

## 2019-02-17 NOTE — Telephone Encounter (Signed)
Left message for patient to call back for a fax number to send her FMLA paperwork.

## 2019-02-21 ENCOUNTER — Other Ambulatory Visit: Payer: Self-pay

## 2019-02-21 ENCOUNTER — Other Ambulatory Visit
Admission: RE | Admit: 2019-02-21 | Discharge: 2019-02-21 | Disposition: A | Discharge status: Home / Self Care | Payer: Managed Care, Other (non HMO) | Source: Ambulatory Visit | Attending: Surgery | Admitting: Surgery

## 2019-02-21 DIAGNOSIS — Z20828 Contact with and (suspected) exposure to other viral communicable diseases: Secondary | ICD-10-CM | POA: Diagnosis not present

## 2019-02-21 DIAGNOSIS — Z01812 Encounter for preprocedural laboratory examination: Secondary | ICD-10-CM | POA: Insufficient documentation

## 2019-02-21 LAB — CBC
HCT: 42 % (ref 36.0–46.0)
Hemoglobin: 14.4 g/dL (ref 12.0–15.0)
MCH: 29.2 pg (ref 26.0–34.0)
MCHC: 34.3 g/dL (ref 30.0–36.0)
MCV: 85.2 fL (ref 80.0–100.0)
Platelets: 336 10*3/uL (ref 150–400)
RBC: 4.93 MIL/uL (ref 3.87–5.11)
RDW: 12.8 % (ref 11.5–15.5)
WBC: 9 10*3/uL (ref 4.0–10.5)
nRBC: 0 % (ref 0.0–0.2)

## 2019-02-21 LAB — SARS CORONAVIRUS 2 (TAT 6-24 HRS): SARS Coronavirus 2: NEGATIVE

## 2019-02-23 MED ORDER — GENTAMICIN SULFATE 40 MG/ML IJ SOLN
5.0000 mg/kg | INTRAVENOUS | Status: AC
  Administered 2019-02-24: 11:00:00 430 mg via INTRAVENOUS
  Filled 2019-02-23: qty 10.75

## 2019-02-23 MED ORDER — CLINDAMYCIN PHOSPHATE 900 MG/50ML IV SOLN
900.0000 mg | INTRAVENOUS | Status: AC
  Administered 2019-02-24: 11:00:00 900 mg via INTRAVENOUS

## 2019-02-24 ENCOUNTER — Encounter
Admission: RE | Disposition: A | Discharge status: Home / Self Care | Payer: Self-pay | Source: Home / Self Care | Attending: Surgery

## 2019-02-24 ENCOUNTER — Ambulatory Visit
Admission: RE | Admit: 2019-02-24 | Discharge: 2019-02-24 | Disposition: A | Discharge status: Home / Self Care | Payer: Managed Care, Other (non HMO) | Source: Ambulatory Visit | Attending: Surgery | Admitting: Surgery

## 2019-02-24 ENCOUNTER — Ambulatory Visit: Payer: Managed Care, Other (non HMO) | Admitting: Certified Registered Nurse Anesthetist

## 2019-02-24 ENCOUNTER — Encounter: Payer: Self-pay | Admitting: *Deleted

## 2019-02-24 ENCOUNTER — Other Ambulatory Visit: Payer: Self-pay

## 2019-02-24 ENCOUNTER — Ambulatory Visit
Admission: RE | Admit: 2019-02-24 | Discharge: 2019-02-24 | Disposition: A | Discharge status: Home / Self Care | Payer: Managed Care, Other (non HMO) | Attending: Surgery | Admitting: Surgery

## 2019-02-24 DIAGNOSIS — K219 Gastro-esophageal reflux disease without esophagitis: Secondary | ICD-10-CM | POA: Diagnosis not present

## 2019-02-24 DIAGNOSIS — D241 Benign neoplasm of right breast: Secondary | ICD-10-CM | POA: Diagnosis not present

## 2019-02-24 DIAGNOSIS — N6489 Other specified disorders of breast: Secondary | ICD-10-CM | POA: Diagnosis present

## 2019-02-24 DIAGNOSIS — N6021 Fibroadenosis of right breast: Secondary | ICD-10-CM | POA: Diagnosis not present

## 2019-02-24 DIAGNOSIS — Z803 Family history of malignant neoplasm of breast: Secondary | ICD-10-CM | POA: Diagnosis not present

## 2019-02-24 HISTORY — PX: BREAST LUMPECTOMY WITH NEEDLE LOCALIZATION: SHX5759

## 2019-02-24 HISTORY — PX: BREAST LUMPECTOMY: SHX2

## 2019-02-24 SURGERY — BREAST LUMPECTOMY WITH NEEDLE LOCALIZATION
Anesthesia: General | Laterality: Right

## 2019-02-24 MED ORDER — MIDAZOLAM HCL 2 MG/2ML IJ SOLN
INTRAMUSCULAR | Status: DC | PRN
  Administered 2019-02-24: 2 mg via INTRAVENOUS

## 2019-02-24 MED ORDER — CHLORHEXIDINE GLUCONATE CLOTH 2 % EX PADS: 6.0000 | MEDICATED_PAD | Freq: Once | CUTANEOUS | Status: DC

## 2019-02-24 MED ORDER — METOCLOPRAMIDE HCL 5 MG/ML IJ SOLN
INTRAMUSCULAR | Status: AC
  Filled 2019-02-24: qty 2

## 2019-02-24 MED ORDER — PROPOFOL 10 MG/ML IV BOLUS
INTRAVENOUS | Status: AC
  Filled 2019-02-24: qty 20

## 2019-02-24 MED ORDER — MIDAZOLAM HCL 2 MG/2ML IJ SOLN
INTRAMUSCULAR | Status: AC
  Filled 2019-02-24: qty 2

## 2019-02-24 MED ORDER — LIDOCAINE HCL (PF) 2 % IJ SOLN
INTRAMUSCULAR | Status: AC
  Filled 2019-02-24: qty 10

## 2019-02-24 MED ORDER — FAMOTIDINE 20 MG PO TABS
20.0000 mg | ORAL_TABLET | Freq: Once | ORAL | Status: AC
  Administered 2019-02-24: 09:00:00 20 mg via ORAL

## 2019-02-24 MED ORDER — FENTANYL CITRATE (PF) 100 MCG/2ML IJ SOLN
INTRAMUSCULAR | Status: DC | PRN
  Administered 2019-02-24 (×4): 25 ug via INTRAVENOUS

## 2019-02-24 MED ORDER — PROPOFOL 10 MG/ML IV BOLUS
INTRAVENOUS | Status: DC | PRN
  Administered 2019-02-24: 140 mg via INTRAVENOUS

## 2019-02-24 MED ORDER — ONDANSETRON HCL 4 MG PO TABS: 4.0000 mg | ORAL_TABLET | Freq: Three times a day (TID) | ORAL | 0 refills | Status: DC | PRN

## 2019-02-24 MED ORDER — ONDANSETRON HCL 4 MG/2ML IJ SOLN
INTRAMUSCULAR | Status: AC
  Administered 2019-02-24: 4 mg via INTRAVENOUS
  Filled 2019-02-24: qty 2

## 2019-02-24 MED ORDER — CELECOXIB 200 MG PO CAPS
ORAL_CAPSULE | ORAL | Status: AC
  Administered 2019-02-24: 200 mg via ORAL
  Filled 2019-02-24: qty 1

## 2019-02-24 MED ORDER — ONDANSETRON HCL 4 MG/2ML IJ SOLN
4.0000 mg | Freq: Once | INTRAMUSCULAR | Status: AC | PRN
  Administered 2019-02-24: 14:00:00 4 mg via INTRAVENOUS

## 2019-02-24 MED ORDER — FENTANYL CITRATE (PF) 100 MCG/2ML IJ SOLN
INTRAMUSCULAR | Status: AC
  Filled 2019-02-24: qty 2

## 2019-02-24 MED ORDER — CLINDAMYCIN PHOSPHATE 900 MG/50ML IV SOLN
INTRAVENOUS | Status: AC
  Filled 2019-02-24: qty 50

## 2019-02-24 MED ORDER — LACTATED RINGERS IV SOLN
INTRAVENOUS | Status: DC
  Administered 2019-02-24: 09:00:00 via INTRAVENOUS

## 2019-02-24 MED ORDER — FENTANYL CITRATE (PF) 100 MCG/2ML IJ SOLN
25.0000 ug | INTRAMUSCULAR | Status: DC | PRN
  Administered 2019-02-24: 13:00:00 25 ug via INTRAVENOUS

## 2019-02-24 MED ORDER — GABAPENTIN 300 MG PO CAPS
ORAL_CAPSULE | ORAL | Status: AC
  Administered 2019-02-24: 300 mg via ORAL
  Filled 2019-02-24: qty 1

## 2019-02-24 MED ORDER — ACETAMINOPHEN 500 MG PO TABS
1000.0000 mg | ORAL_TABLET | ORAL | Status: AC
  Administered 2019-02-24: 09:00:00 1000 mg via ORAL

## 2019-02-24 MED ORDER — FENTANYL CITRATE (PF) 100 MCG/2ML IJ SOLN
INTRAMUSCULAR | Status: AC
  Administered 2019-02-24: 25 ug via INTRAVENOUS
  Filled 2019-02-24: qty 2

## 2019-02-24 MED ORDER — GABAPENTIN 300 MG PO CAPS
300.0000 mg | ORAL_CAPSULE | ORAL | Status: AC
  Administered 2019-02-24: 09:00:00 300 mg via ORAL

## 2019-02-24 MED ORDER — DEXAMETHASONE SODIUM PHOSPHATE 10 MG/ML IJ SOLN
INTRAMUSCULAR | Status: DC | PRN
  Administered 2019-02-24: 5 mg via INTRAVENOUS

## 2019-02-24 MED ORDER — ONDANSETRON HCL 4 MG/2ML IJ SOLN
INTRAMUSCULAR | Status: DC | PRN
  Administered 2019-02-24: 4 mg via INTRAVENOUS

## 2019-02-24 MED ORDER — FAMOTIDINE 20 MG PO TABS
ORAL_TABLET | ORAL | Status: AC
  Administered 2019-02-24: 09:00:00 20 mg via ORAL
  Filled 2019-02-24: qty 1

## 2019-02-24 MED ORDER — LIDOCAINE HCL (CARDIAC) PF 100 MG/5ML IV SOSY
PREFILLED_SYRINGE | INTRAVENOUS | Status: DC | PRN
  Administered 2019-02-24: 80 mg via INTRAVENOUS

## 2019-02-24 MED ORDER — EPINEPHRINE PF 1 MG/ML IJ SOLN
INTRAMUSCULAR | Status: AC
  Filled 2019-02-24: qty 1

## 2019-02-24 MED ORDER — METOCLOPRAMIDE HCL 5 MG/ML IJ SOLN: 10.0000 mg | Freq: Once | INTRAMUSCULAR | Status: DC

## 2019-02-24 MED ORDER — HYDROCODONE-ACETAMINOPHEN 5-325 MG PO TABS: 1.0000 | ORAL_TABLET | Freq: Four times a day (QID) | ORAL | 0 refills | Status: DC | PRN

## 2019-02-24 MED ORDER — CELECOXIB 200 MG PO CAPS
200.0000 mg | ORAL_CAPSULE | ORAL | Status: AC
  Administered 2019-02-24: 09:00:00 200 mg via ORAL

## 2019-02-24 MED ORDER — BUPIVACAINE HCL (PF) 0.25 % IJ SOLN
INTRAMUSCULAR | Status: AC
  Filled 2019-02-24: qty 30

## 2019-02-24 MED ORDER — ACETAMINOPHEN 500 MG PO TABS
ORAL_TABLET | ORAL | Status: AC
  Administered 2019-02-24: 1000 mg via ORAL
  Filled 2019-02-24: qty 2

## 2019-02-24 SURGICAL SUPPLY — 29 items
APPLIER CLIP 9.375 SM OPEN (CLIP)
CANISTER SUCT 1200ML W/VALVE (MISCELLANEOUS) ×3 IMPLANT
CHLORAPREP W/TINT 26 (MISCELLANEOUS) ×3 IMPLANT
CLIP APPLIE 9.375 SM OPEN (CLIP) IMPLANT
COVER WAND RF STERILE (DRAPES) ×3 IMPLANT
DERMABOND ADVANCED (GAUZE/BANDAGES/DRESSINGS) ×2
DERMABOND ADVANCED .7 DNX12 (GAUZE/BANDAGES/DRESSINGS) ×1 IMPLANT
DRAPE CHEST BREAST 77X106 FENE (MISCELLANEOUS) ×3 IMPLANT
ELECT CAUTERY BLADE 6.4 (BLADE) ×3 IMPLANT
ELECT REM PT RETURN 9FT ADLT (ELECTROSURGICAL) ×3
ELECTRODE REM PT RTRN 9FT ADLT (ELECTROSURGICAL) ×1 IMPLANT
GLOVE BIO SURGEON STRL SZ7 (GLOVE) ×15 IMPLANT
GLOVE INDICATOR 7.5 STRL GRN (GLOVE) ×15 IMPLANT
GOWN STRL REUS W/ TWL LRG LVL3 (GOWN DISPOSABLE) ×5 IMPLANT
GOWN STRL REUS W/TWL LRG LVL3 (GOWN DISPOSABLE) ×10
KIT TURNOVER KIT A (KITS) ×3 IMPLANT
MARGIN MAP 10MM (MISCELLANEOUS) ×3 IMPLANT
NEEDLE HYPO 22GX1.5 SAFETY (NEEDLE) ×3 IMPLANT
PACK BASIN MINOR ARMC (MISCELLANEOUS) ×3 IMPLANT
SPONGE LAP 18X18 RF (DISPOSABLE) ×3 IMPLANT
SUT MNCRL 4-0 (SUTURE) ×4
SUT MNCRL 4-0 27XMFL (SUTURE) ×2
SUT SILK 2 0 SH (SUTURE) ×3 IMPLANT
SUT VIC AB 2-0 SH 27 (SUTURE) ×4
SUT VIC AB 2-0 SH 27XBRD (SUTURE) ×2 IMPLANT
SUT VIC AB 3-0 SH 27 (SUTURE) ×2
SUT VIC AB 3-0 SH 27X BRD (SUTURE) ×1 IMPLANT
SUTURE MNCRL 4-0 27XMF (SUTURE) ×2 IMPLANT
WATER STERILE IRR 1000ML POUR (IV SOLUTION) ×3 IMPLANT

## 2019-02-24 NOTE — Anesthesia Post-op Follow-up Note (Signed)
Anesthesia QCDR form completed.        

## 2019-02-24 NOTE — H&P (Signed)
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 She was followed by Dr. Bary Castilla previously but never had a definitive excisional biopsy of the abnormal lesion. Please note that I have personally reviewed the mammogram ultrasound and MRI ;there is evidence of an asymmetryAnd distortion on the right breast, there are actually two abnormal areas.  She did have a recent MRI bx showing evidence of COMPLEX SCLEROSING LESION   SHe has had a previous biopsy showing evidence of intraductal papilloma and sclerosing adenosis as well as a stromal fibrosis.  SHe does have a very strong history of breast cancer in her mother and 2 maternal aunts. She Did have a prophylactic BSO and hysterectomy due to a gene mutation that increases her risk for ovarian cancer. She was BRCAnegative.  She wishes to undergo excision       Past Medical History:  Diagnosis Date  . Abnormal Pap smear of cervix   . Arthritis    hips   . BRCA negative 02/04/2012   Sample completed by [facility]. Performing laboratory: [facility]. Assession #:[id number] LD  . Cystitis   . Family history of adverse reaction to anesthesia    pts sisters bp drops with epi- pt collapsed after colonoscopy  . Family history of breast cancer   . GERD (gastroesophageal reflux disease)    occ-tums prn  . Headache    h/o migraines  . History of kidney stones    h/o  . Increased risk of breast cancer 2013, 2019   riskscore=28.6% due to [place] (not BRIP1 mutation)  . 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 APPENDECTOMY    . BREAST BIOPSY Right 07/06/2001   Stereotactic biopsy showing fibrocystic changes, apocrine metaplasia and ductal adenosis, ductal epithelial hyperplasia without atypia.  Marland Kitchen BREAST BIOPSY  Right 11/10/2016   A. sclerosing adenosis, dicordant per radiologist  . BREAST CYST ASPIRATION Left 1994  . COLONOSCOPY N/A 04/02/2015   Procedure: COLONOSCOPY;  Surgeon: Manya Silvas, MD;  Location: [facility] ENDOSCOPY;  Service: Endoscopy;  Laterality: N/A;  . CYSTOSCOPY N/A 05/06/2018   Procedure: CYSTOSCOPY;  Surgeon: Gae Dry, MD;  Location: [facility] ORS;  Service: Gynecology;  Laterality: N/A;  . TONSILLECTOMY           Family History  Problem Relation Age of Onset  . Breast cancer Mother 41  . Bone cancer Mother   . Diabetes Mother   . Breast cancer Maternal Aunt        post menopausal   . Heart disease Father   . Thyroid disease Father   . Colon polyps Sister   . Cancer Sister   . Breast cancer Maternal Aunt        post menopausal   . Ovarian cancer Maternal Aunt        post menopausal     Social History:  reports that she has never smoked. She has never used smokeless tobacco. She reports that she does not drink alcohol or use drugs.  Allergies:       Allergies  Allergen Reactions  . Doxycycline Nausea And Vomiting and Other (See Comments)    dizzy  . Phenergan [Promethazine Hcl] Nausea And Vomiting  . Tramadol Nausea And Vomiting and Other (See Comments)    dizzy  .  Keflex [Cephalexin] Rash  . Ketek [Telithromycin] Itching and Rash  . Levaquin [Levofloxacin In D5w] Rash  . Nitrofurantoin Monohyd Macro Rash  . Other Rash    Muscle relaxer medication but not flexeril or robaxin pt unsure of the name.   . Oxycodone Rash  . Parafon Forte Dsc [Chlorzoxazone] Rash    Medications reviewed.    ROS Full ROS performed and is otherwise negative other than what is stated in HPI    Physical Exam Vitals signs and nursing note reviewed. Exam conducted with a chaperone present.  Constitutional:      General: She is not in acute distress.    Appearance: Normal appearance. She is not ill-appearing.  Eyes:     General:         Right eye: No discharge.        Left eye: No discharge.     Extraocular Movements: Extraocular movements intact.  Neck:     Musculoskeletal: Normal range of motion and neck supple. No neck rigidity.  Cardiovascular:     Rate and Rhythm: Normal rate and regular rhythm.     Pulses: Normal pulses.     Heart sounds: No murmur.  Pulmonary:     Effort: Pulmonary effort is normal. No respiratory distress.     Breath sounds: Normal breath sounds. No stridor. No wheezing or rhonchi.     Comments: BREAST: no evidence of palpable masses, nipple and areola w/o lesions no LAD. THere is a slight asymmetry w the left breast being more pedunculated. Abdominal:     General: Abdomen is flat. There is no distension.     Palpations: There is no mass.     Tenderness: There is no abdominal tenderness. There is no guarding or rebound.     Hernia: No hernia is present.  Musculoskeletal: Normal range of motion.        General: No swelling or tenderness.  Skin:    General: Skin is warm and dry.     Capillary Refill: Capillary refill takes 2 to 3 seconds.     Coloration: Skin is not jaundiced.  Neurological:     General: No focal deficit present.     Mental Status: She is alert and oriented to person, place, and time.  Psychiatric:        Mood and Affect: Mood normal.        Behavior: Behavior normal.        Thought Content: Thought content normal.        Judgment: Judgment normal.         Assessment/Plan:  58 yo female with radial scar on 2 MRIs abnormalities on the right breast.  There is evidence of acomplex sclerosing lesion.  She wishes to undergo excisional biopsy.  We will plan for needle guided lumpectomy.  Procedure discussed with the patient in detail.  Risk benefits and possible complications including but not limited to: Bleeding, infection, deformity and cosmetic issues.  She understands and wishes to proceed.  Judeth Horn MD FACS

## 2019-02-24 NOTE — Op Note (Signed)
  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 The benefits, complications, treatment options, and expected outcomes were discussed with the patient. The risks of bleeding, infection, recurrence of symptoms, failure to resolve symptoms, hematoma, seroma, open wound, cosmetic deformity, and the need for further surgery were discussed.  The patient was taken to Operating Room, identified as Michelle Mejia and the procedure verified.  A Time Out was held and the above information confirmed. [name] has had 2 benign lesions that one was a radial scar and the second was was a intraductal papilloma that required excisional biopsy.  We were able to do a needle guided localization so we could do a lumpectomy and incorporate both lesions within the specimen.  Prior to the induction of general anesthesia, antibiotic prophylaxis was administered. VTE prophylaxis was in place. Appropriate anesthesia was then administered and tolerated well. The chest was prepped with Chloraprep and draped in the sterile fashion. The patient was positioned in the supine position.   Attention was turned to the needle localization site where an incision was made encompassing the needle insertion site in a crescent moon shaped fashion. Dissection around both needle to perform a partial mastectomy with adequate margins was performed. This was done with electrocautery and sharp  dissection. Hemostasis was with electrocautery.  Tissue advancement flaps were performed to decrease the volume deficit in the area of the resection.  Please note that the total void area was 7 x 7 cm equals 49 cm.  The chest wall flaps were created by incising this breast parenchyma from the pectoralis fascia in a circumferential method.  The breast parenchyma was then reapproximated in a deep to superficial fashion using interrupted 2-0 Vicryl sutures.  Please note that I placed 2 deep layers of 2-0 Vicryl's.  Once assuring that hemostasis was adequate and checked multiple times the wound was closed with 4-0 subcuticular Monocryl sutures. Dermabond was placed  Patient was taken to the recovery room in stable condition.   Caroleen Hamman , MD, FACS

## 2019-02-24 NOTE — Anesthesia Preprocedure Evaluation (Signed)
Anesthesia Evaluation  Patient identified by MRN, date of birth, ID band Patient awake    Reviewed: Allergy & Precautions, NPO status , Patient's Chart, lab work & pertinent test results  History of Anesthesia Complications (+) PROLONGED EMERGENCE and history of anesthetic complications (Pt states that the pain meds post-op made it hard to wake up)  Airway Mallampati: II       Dental   Pulmonary neg sleep apnea, neg COPD, Not current smoker,           Cardiovascular (-) hypertension(-) Past MI and (-) CHF (-) dysrhythmias (-) Valvular Problems/Murmurs     Neuro/Psych neg Seizures    GI/Hepatic Neg liver ROS, GERD  ,  Endo/Other  neg diabetes  Renal/GU negative Renal ROS     Musculoskeletal   Abdominal   Peds  Hematology   Anesthesia Other Findings   Reproductive/Obstetrics                             Anesthesia Physical Anesthesia Plan  ASA: II  Anesthesia Plan: General   Post-op Pain Management:    Induction: Intravenous  PONV Risk Score and Plan: 3 and Dexamethasone, Ondansetron and Midazolam  Airway Management Planned: Oral ETT  Additional Equipment:   Intra-op Plan:   Post-operative Plan:   Informed Consent: I have reviewed the patients History and Physical, chart, labs and discussed the procedure including the risks, benefits and alternatives for the proposed anesthesia with the patient or authorized representative who has indicated his/her understanding and acceptance.       Plan Discussed with:   Anesthesia Plan Comments:         Anesthesia Quick Evaluation

## 2019-02-24 NOTE — Discharge Instructions (Addendum)
AMBULATORY SURGERY  °DISCHARGE INSTRUCTIONS ° ° °1) The drugs that you were given will stay in your system until tomorrow so for the next 24 hours you should not: ° °A) Drive an automobile °B) Make any legal decisions °C) Drink any alcoholic beverage ° ° °2) You may resume regular meals tomorrow.  Today it is better to start with liquids and gradually work up to solid foods. ° °You may eat anything you prefer, but it is better to start with liquids, then soup and crackers, and gradually work up to solid foods. ° ° °3) Please notify your doctor immediately if you have any unusual bleeding, trouble breathing, redness and pain at the surgery site, drainage, fever, or pain not relieved by medication. ° ° ° °4) Additional Instructions: ° ° ° ° ° ° ° °Please contact your physician with any problems or Same Day Surgery at [phone], Monday through Friday 6 am to 4 pm, or [place] [facility] [place] Main number at [phone]. °

## 2019-02-24 NOTE — Anesthesia Procedure Notes (Signed)
Procedure Name: LMA Insertion Date/Time: 02/24/2019 11:02 AM Performed by: Jerrye Noble, CRNA Pre-anesthesia Checklist: Patient identified, Emergency Drugs available, Suction available and Patient being monitored Patient Re-evaluated:Patient Re-evaluated prior to induction Oxygen Delivery Method: Circle system utilized Preoxygenation: Pre-oxygenation with 100% oxygen Induction Type: IV induction Ventilation: Mask ventilation without difficulty LMA: LMA inserted LMA Size: 4.5 Number of attempts: 1 Placement Confirmation: positive ETCO2 and breath sounds checked- equal and bilateral Dental Injury: Teeth and Oropharynx as per pre-operative assessment

## 2019-02-24 NOTE — Anesthesia Postprocedure Evaluation (Signed)
Anesthesia Post Note  Patient: Michelle Mejia  Procedure(s) Performed: BREAST LUMPECTOMY WITH NEEDLE LOCALIZATION (Right )  Patient location during evaluation: PACU Anesthesia Type: General Level of consciousness: awake and alert Pain management: pain level controlled Vital Signs Assessment: post-procedure vital signs reviewed and stable Respiratory status: spontaneous breathing and respiratory function stable Cardiovascular status: stable Anesthetic complications: no     Last Vitals:  Vitals:   02/24/19 1230 02/24/19 1233  BP: 111/79 111/79  Pulse: 77 76  Resp: 12 13  Temp: (!) 36.2 C   SpO2: 98% 98%    Last Pain:  Vitals:   02/24/19 0901  TempSrc: Temporal  PainSc: 0-No pain                 KEPHART,WILLIAM K

## 2019-02-24 NOTE — Transfer of Care (Signed)
Immediate Anesthesia Transfer of Care Note  Patient: Michelle Mejia  Procedure(s) Performed: BREAST LUMPECTOMY WITH NEEDLE LOCALIZATION (Right )  Patient Location: PACU  Anesthesia Type:General  Level of Consciousness: sedated  Airway & Oxygen Therapy: Patient Spontanous Breathing and Patient connected to face mask oxygen  Post-op Assessment: Report given to RN and Post -op Vital signs reviewed and stable  Post vital signs: Reviewed and stable  Last Vitals:  Vitals Value Taken Time  BP 111/79 02/24/19 1233  Temp    Pulse 76 02/24/19 1233  Resp 13 02/24/19 1233  SpO2 98 % 02/24/19 1233    Last Pain:  Vitals:   02/24/19 0901  TempSrc: Temporal  PainSc: 0-No pain         Complications: No apparent anesthesia complications

## 2019-02-25 ENCOUNTER — Encounter: Payer: Self-pay | Admitting: Surgery

## 2019-02-28 LAB — SURGICAL PATHOLOGY

## 2019-03-09 ENCOUNTER — Encounter: Payer: Managed Care, Other (non HMO) | Admitting: Surgery

## 2019-03-21 ENCOUNTER — Encounter: Payer: Managed Care, Other (non HMO) | Admitting: Surgery

## 2019-03-28 ENCOUNTER — Ambulatory Visit (INDEPENDENT_AMBULATORY_CARE_PROVIDER_SITE_OTHER): Payer: Managed Care, Other (non HMO) | Admitting: Surgery

## 2019-03-28 ENCOUNTER — Encounter: Payer: Self-pay | Admitting: Surgery

## 2019-03-28 ENCOUNTER — Other Ambulatory Visit: Payer: Self-pay

## 2019-03-28 VITALS — BP 119/81 | HR 84 | Temp 97.1°F | Ht 64.0 in | Wt 188.0 lb

## 2019-03-28 DIAGNOSIS — Z09 Encounter for follow-up examination after completed treatment for conditions other than malignant neoplasm: Secondary | ICD-10-CM

## 2019-03-28 NOTE — Patient Instructions (Signed)
We will see you back in August with your mammogram.

## 2019-03-28 NOTE — Progress Notes (Signed)
S/p lumpectomy Left breast path reviewed and d/w pt in detail. No evidence of malignancy or concerning lesions She is doing very well , minimal discomfort and great esthetics   PE NAD Wound healing well, no deformity. No infection  A/p Doing very well F/u mammo 11/2019

## 2019-08-18 ENCOUNTER — Ambulatory Visit: Payer: Managed Care, Other (non HMO) | Admitting: Nurse Practitioner

## 2019-08-18 ENCOUNTER — Other Ambulatory Visit: Payer: Self-pay

## 2019-08-18 VITALS — BP 130/76 | HR 72 | Temp 97.6°F | Ht 64.0 in | Wt 170.0 lb

## 2019-08-18 DIAGNOSIS — Z008 Encounter for other general examination: Secondary | ICD-10-CM | POA: Diagnosis not present

## 2019-08-18 NOTE — Patient Instructions (Signed)
Health Maintenance, Female Adopting a healthy lifestyle and getting preventive care are important in promoting health and wellness. Ask your health care provider about:  The right schedule for you to have regular tests and exams.  Things you can do on your own to prevent diseases and keep yourself healthy. What should I know about diet, weight, and exercise? Eat a healthy diet   Eat a diet that includes plenty of vegetables, fruits, low-fat dairy products, and lean protein.  Do not eat a lot of foods that are high in solid fats, added sugars, or sodium. Maintain a healthy weight Body mass index (BMI) is used to identify weight problems. It estimates body fat based on height and weight. Your health care provider can help determine your BMI and help you achieve or maintain a healthy weight. Get regular exercise Get regular exercise. This is one of the most important things you can do for your health. Most adults should:  Exercise for at least 150 minutes each week. The exercise should increase your heart rate and make you sweat (moderate-intensity exercise).  Do strengthening exercises at least twice a week. This is in addition to the moderate-intensity exercise.  Spend less time sitting. Even light physical activity can be beneficial. Watch cholesterol and blood lipids Have your blood tested for lipids and cholesterol at 57 years of age, then have this test every 5 years. Have your cholesterol levels checked more often if:  Your lipid or cholesterol levels are high.  You are older than 57 years of age.  You are at high risk for heart disease. What should I know about cancer screening? Depending on your health history and family history, you may need to have cancer screening at various ages. This may include screening for:  Breast cancer.  Cervical cancer.  Colorectal cancer.  Skin cancer.  Lung cancer. What should I know about heart disease, diabetes, and high blood  pressure? Blood pressure and heart disease  High blood pressure causes heart disease and increases the risk of stroke. This is more likely to develop in people who have high blood pressure readings, are of African descent, or are overweight.  Have your blood pressure checked: ? Every 3-5 years if you are 18-39 years of age. ? Every year if you are 40 years old or older. Diabetes Have regular diabetes screenings. This checks your fasting blood sugar level. Have the screening done:  Once every three years after age 40 if you are at a normal weight and have a low risk for diabetes.  More often and at a younger age if you are overweight or have a high risk for diabetes. What should I know about preventing infection? Hepatitis B If you have a higher risk for hepatitis B, you should be screened for this virus. Talk with your health care provider to find out if you are at risk for hepatitis B infection. Hepatitis C Testing is recommended for:  Everyone born from 1945 through 1965.  Anyone with known risk factors for hepatitis C. Sexually transmitted infections (STIs)  Get screened for STIs, including gonorrhea and chlamydia, if: ? You are sexually active and are younger than 57 years of age. ? You are older than 57 years of age and your health care provider tells you that you are at risk for this type of infection. ? Your sexual activity has changed since you were last screened, and you are at increased risk for chlamydia or gonorrhea. Ask your health care provider if   you are at risk.  Ask your health care provider about whether you are at high risk for HIV. Your health care provider may recommend a prescription medicine to help prevent HIV infection. If you choose to take medicine to prevent HIV, you should first get tested for HIV. You should then be tested every 3 months for as long as you are taking the medicine. Pregnancy  If you are about to stop having your period (premenopausal) and  you may become pregnant, seek counseling before you get pregnant.  Take 400 to 800 micrograms (mcg) of folic acid every day if you become pregnant.  Ask for birth control (contraception) if you want to prevent pregnancy. Osteoporosis and menopause Osteoporosis is a disease in which the bones lose minerals and strength with aging. This can result in bone fractures. If you are 65 years old or older, or if you are at risk for osteoporosis and fractures, ask your health care provider if you should:  Be screened for bone loss.  Take a calcium or vitamin D supplement to lower your risk of fractures.  Be given hormone replacement therapy (HRT) to treat symptoms of menopause. Follow these instructions at home: Lifestyle  Do not use any products that contain nicotine or tobacco, such as cigarettes, e-cigarettes, and chewing tobacco. If you need help quitting, ask your health care provider.  Do not use street drugs.  Do not share needles.  Ask your health care provider for help if you need support or information about quitting drugs. Alcohol use  Do not drink alcohol if: ? Your health care provider tells you not to drink. ? You are pregnant, may be pregnant, or are planning to become pregnant.  If you drink alcohol: ? Limit how much you use to 0-1 drink a day. ? Limit intake if you are breastfeeding.  Be aware of how much alcohol is in your drink. In the [place] one drink equals one 12 oz bottle of beer (355 mL), one 5 oz glass of wine (148 mL), or one 1 oz glass of hard liquor (44 mL). General instructions  Schedule regular health, dental, and eye exams.  Stay current with your vaccines.  Tell your health care provider if: ? You often feel depressed. ? You have ever been abused or do not feel safe at home. Summary  Adopting a healthy lifestyle and getting preventive care are important in promoting health and wellness.  Follow your health care provider's instructions about healthy  diet, exercising, and getting tested or screened for diseases.  Follow your health care provider's instructions on monitoring your cholesterol and blood pressure. This information is not intended to replace advice given to you by your health care provider. Make sure you discuss any questions you have with your health care provider. Document Revised: 03/24/2018 Document Reviewed: 03/24/2018 Elsevier Patient Education  2020 [employer].  

## 2019-08-18 NOTE — Progress Notes (Signed)
Subjective:     Patient ID: Michelle Mejia, female   DOB: [birth year], 59 y.o.   MRN: [id number]  BP 130/76 (BP Location: Left Arm, Patient Position: Sitting, Cuff Size: Normal)   Pulse 72   Temp 97.6 F (36.4 C) (Temporal)   Ht '5\' 4"'  (1.626 m)   Wt 170 lb (77.1 kg)   LMP  (LMP Unknown) Comment: pt negative for BRCA Gene for breast ca but positive ovarian ca. Complete hysterectomy done 04/2018  SpO2 98%   BMI 29.18 kg/m    HPI Michelle Mejia is a 59 y.o. 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 She presents to the [facility] clinic today for her biometric screening exam. She reports she enjoys her job but works 12 hour shifts and sits most of the time.   PMH: Uterine fibroid,abnormal pap smear, high risk for ovarian cancer, monoallelic mutation of BRIP1 gene SOB on exertion, arthritis, GERD, Kidney stones  Surgeries: total lap hysterectomy, tonsillectomy, breast lumpectomy, appendectomy  SH: denies use of tobacco, ETOH or drugs, sitting job and does not exercise or have a healthy diet.   Medications:Calcium, voltaren gel, ibuprofen, vitamins  Allergies: Doxycycline, phenergan, tramadol, keflex, levaquin, macrobid, oxycodone, parafon forte  Immunizations: UTD, has not had Covid vaccine but does plan on taking it.   Review of Systems  Gastrointestinal: Negative for abdominal pain (occasional pain s/p hysterectomy).  All other systems reviewed and are negative.      Objective:   Physical Exam Vitals and nursing note reviewed.  Constitutional:      Appearance: She is obese.  HENT:     Head: Normocephalic and atraumatic.     Right Ear: Tympanic membrane and ear canal normal.     Left Ear: Tympanic membrane and ear canal normal.     Nose: Nose normal.     Mouth/Throat:     Mouth: Mucous membranes are moist.     Pharynx: Oropharynx is clear.  Eyes:     Extraocular Movements: Extraocular movements intact.     Conjunctiva/sclera: Conjunctivae normal.    Pupils: Pupils are equal, round, and reactive to light.  Neck:     Thyroid: No thyroid mass.     Vascular: No carotid bruit.     Trachea: Trachea normal.  Cardiovascular:     Rate and Rhythm: Normal rate and regular rhythm.     Pulses: Normal pulses.  Pulmonary:     Effort: Pulmonary effort is normal.     Breath sounds: Normal breath sounds.  Abdominal:     Tenderness: There is no abdominal tenderness.  Musculoskeletal:        General: Normal range of motion.     Cervical back: Normal range of motion. No tenderness.     Comments: Can bend and touch toes, move side to side and twist without pain or difficulty.  Lymphadenopathy:     Cervical: No cervical adenopathy.  Skin:    General: Skin is warm and dry.  Neurological:     General: No focal deficit present.     Mental Status: She is alert.     Deep Tendon Reflexes:     Reflex Scores:      Bicep reflexes are 2+ on the right side and 2+ on the left side.      Brachioradialis reflexes are 2+ on the right side and 2+ on the left side.      Patellar reflexes are 2+ on the right side and  2+ on the left side.    Comments: Ambulatory without difficulty steady gait, stands on one foot without difficulty. Grips are equal. Radial pulses 2+.   Psychiatric:        Mood and Affect: Mood normal.        Behavior: Behavior normal.        Assessment:    59 y.o. female here for her biometric screening exam without complaints. Normal limited exam.     Plan:    Discussed with the employee need for healthy diet and regular exercise. Discussed importance of leaving the office during lunch and going for a walk or doing some activity rather than sitting for 12 hours. Employee given instructions verbally and in writing regarding healthy living. Employee given time to ask questions and express any concerns. She will f/u with her her PCP for ongoing health care. RTC prn.

## 2019-08-19 LAB — GLUCOSE, RANDOM: Glucose: 111 mg/dL — ABNORMAL HIGH (ref 65–99)

## 2019-08-19 LAB — LIPID PANEL
Chol/HDL Ratio: 3.6 ratio (ref 0.0–4.4)
Cholesterol, Total: 192 mg/dL (ref 100–199)
HDL: 54 mg/dL (ref >39)
LDL Chol Calc (NIH): 118 mg/dL — ABNORMAL HIGH (ref 0–99)
Triglycerides: 109 mg/dL (ref 0–149)
VLDL Cholesterol Cal: 20 mg/dL (ref 5–40)

## 2019-09-26 ENCOUNTER — Other Ambulatory Visit: Payer: Self-pay

## 2019-09-26 DIAGNOSIS — Z1231 Encounter for screening mammogram for malignant neoplasm of breast: Secondary | ICD-10-CM

## 2019-11-17 ENCOUNTER — Ambulatory Visit: Payer: Managed Care, Other (non HMO) | Admitting: Obstetrics & Gynecology

## 2019-11-18 ENCOUNTER — Ambulatory Visit: Payer: Managed Care, Other (non HMO) | Admitting: Obstetrics & Gynecology

## 2019-11-30 ENCOUNTER — Other Ambulatory Visit: Payer: Self-pay

## 2019-11-30 ENCOUNTER — Ambulatory Visit
Admission: RE | Admit: 2019-11-30 | Discharge: 2019-11-30 | Disposition: A | Discharge status: Home / Self Care | Payer: Managed Care, Other (non HMO) | Source: Ambulatory Visit | Attending: Surgery | Admitting: Surgery

## 2019-11-30 ENCOUNTER — Telehealth: Payer: Self-pay

## 2019-11-30 DIAGNOSIS — Z1231 Encounter for screening mammogram for malignant neoplasm of breast: Secondary | ICD-10-CM | POA: Insufficient documentation

## 2019-11-30 NOTE — Telephone Encounter (Signed)
-----   Message from Jules Husbands, MD sent at 11/30/2019 11:03 AM EDT ----- Please let her know that her mammo was nml, please keep appt w me ----- Message ----- From: Interface, Rad Results In Sent: 11/30/2019  10:19 AM EDT To: Jules Husbands, MD

## 2019-11-30 NOTE — Telephone Encounter (Signed)
Spoke with patient to notified her of recent mammogram results which were normal and patient was notified that Dr.Pabon would like to keep scheduled appointment. Patient verbalized understanding and has no further questions.

## 2019-12-12 ENCOUNTER — Encounter: Payer: Managed Care, Other (non HMO) | Admitting: Surgery

## 2019-12-21 ENCOUNTER — Encounter: Payer: Self-pay | Admitting: Surgery

## 2019-12-21 ENCOUNTER — Other Ambulatory Visit: Payer: Self-pay

## 2019-12-21 ENCOUNTER — Ambulatory Visit (INDEPENDENT_AMBULATORY_CARE_PROVIDER_SITE_OTHER): Payer: Managed Care, Other (non HMO) | Admitting: Surgery

## 2019-12-21 VITALS — BP 106/72 | HR 82 | Temp 98.4°F | Resp 12 | Ht 64.0 in | Wt 177.0 lb

## 2019-12-21 DIAGNOSIS — N6489 Other specified disorders of breast: Secondary | ICD-10-CM

## 2019-12-21 NOTE — Progress Notes (Signed)
Outpatient Surgical Follow Up  12/21/2019  Michelle Mejia is an 59 y.o. female.   Chief Complaint  Patient presents with  . Follow-up    Bil Screen mammo [facility] 11/30/19    HPI: Michelle Mejia is a 59 year old female status post excisional breast biopsy of the right side showing BENIGN MAMMARY PARENCHYMA WITH COMPLEX SCLEROSING LESION INCLUDING PAPILLOMA.  No evidence of malignancy.  She has recovered well from the surgery and now is following up for yearly mammogram.  She denies any symptoms related to any of her breast.  No lumps, no bumps no breast discharge or pain.  She did have a mammogram that I have personally reviewed showing no evidence of any suspicious lesions.  Past Medical History:  Diagnosis Date  . Abnormal Pap smear of cervix   . Arthritis    hips   . BRCA negative 02/04/2012   Sample completed by [facility]. Performing laboratory: [facility]. Assession #:[id number] LD  . Cystitis   . Family history of adverse reaction to anesthesia    pts sisters bp drops with epi- pt collapsed after colonoscopy  . Family history of breast cancer   . GERD (gastroesophageal reflux disease)    occ-tums prn  . Headache    h/o migraines  . History of kidney stones    h/o  . Increased risk of breast cancer 2013, 2019   riskscore=28.6% due to [place] (not BRIP1 mutation)  . 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 UTI (urinary tract infection)     Past Surgical History:  Procedure Laterality Date  . APPENDECTOMY    . BREAST BIOPSY Right 07/06/2001   Stereotactic biopsy showing fibrocystic changes, apocrine metaplasia and ductal adenosis, ductal epithelial hyperplasia without atypia.  Marland Kitchen BREAST BIOPSY Right 11/10/2016   A. sclerosing adenosis, dicordant per radiologist x marker  . BREAST BIOPSY Right 12/08/2018   MRI bx, dumbell marker, CSL  . BREAST CYST ASPIRATION Left 1994  . BREAST LUMPECTOMY Right 02/24/2019   CSL  . BREAST  LUMPECTOMY WITH NEEDLE LOCALIZATION Right 02/24/2019   Procedure: BREAST LUMPECTOMY WITH NEEDLE LOCALIZATION;  Surgeon: Jules Husbands, MD;  Location: [facility] ORS;  Service: General;  Laterality: Right;  . COLONOSCOPY N/A 04/02/2015   Procedure: COLONOSCOPY;  Surgeon: Manya Silvas, MD;  Location: [facility] ENDOSCOPY;  Service: Endoscopy;  Laterality: N/A;  . CYSTOSCOPY N/A 05/06/2018   Procedure: CYSTOSCOPY;  Surgeon: Gae Dry, MD;  Location: [facility] ORS;  Service: Gynecology;  Laterality: N/A;  . TONSILLECTOMY      Family History  Problem Relation Age of Onset  . Breast cancer Mother 89  . Bone cancer Mother   . Diabetes Mother   . Breast cancer Maternal Aunt        post menopausal   . Heart disease Father   . Thyroid disease Father   . Colon polyps Sister   . Cancer Sister   . Breast cancer Maternal Aunt        post menopausal   . Ovarian cancer Maternal Aunt        post menopausal     Social History:  reports that she has never smoked. She has never used smokeless tobacco. She reports that she does not drink alcohol and does not use drugs.  Allergies:  Allergies  Allergen Reactions  . Doxycycline Nausea And Vomiting and Other (See Comments)    dizzy  . Phenergan [Promethazine Hcl] Nausea And Vomiting  .  Tramadol Nausea And Vomiting and Other (See Comments)    dizzy  . Keflex [Cephalexin] Rash  . Ketek [Telithromycin] Itching and Rash  . Levaquin [Levofloxacin In D5w] Rash  . Nitrofurantoin Monohyd Macro Rash  . Other Rash    Muscle relaxer medication but not flexeril or robaxin pt unsure of the name.   . Oxycodone Rash  . Parafon Forte Dsc [Chlorzoxazone] Rash    Medications reviewed.    ROS Full ROS performed and is otherwise negative other than what is stated in HPI   BP 106/72   Pulse 82   Temp 98.4 F (36.9 C)   Resp 12   Ht $R'5\' 4"'ql$  (1.626 m)   Wt 177 lb (80.3 kg)   LMP  (LMP Unknown) Comment: pt negative for BRCA Gene for breast ca but positive  ovarian ca. Complete hysterectomy done 04/2018  SpO2 96%   BMI 30.38 kg/m   Physical Exam Vitals and nursing note reviewed. Exam conducted with a chaperone present.  Constitutional:      General: She is not in acute distress.    Appearance: Normal appearance. She is normal weight.  Cardiovascular:     Rate and Rhythm: Normal rate and regular rhythm.  Pulmonary:     Effort: Pulmonary effort is normal. No respiratory distress.     Breath sounds: Normal breath sounds. No stridor.     Comments: Right lumpectomy scar with good breast contour, minimal deformity Abdominal:     General: Abdomen is flat. There is no distension.     Palpations: Abdomen is soft. There is no mass.     Tenderness: There is no abdominal tenderness. There is no guarding or rebound.     Hernia: No hernia is present.  Musculoskeletal:     Cervical back: Normal range of motion and neck supple. No rigidity or tenderness.  Lymphadenopathy:     Cervical: No cervical adenopathy.  Skin:    General: Skin is warm and dry.     Capillary Refill: Capillary refill takes less than 2 seconds.  Neurological:     General: No focal deficit present.     Mental Status: She is alert and oriented to person, place, and time.  Psychiatric:        Mood and Affect: Mood normal.        Behavior: Behavior normal.        Thought Content: Thought content normal.        Judgment: Judgment normal.    Assessment/Plan: History of benign breast biopsy on the right side now no evidence of suspicious lesions on clinical exam nor mammogram.  Continue yearly mammograms and physical exams.  No need for any further interventions. Greater than 50% of the 25 minutes  visit was spent in counseling/coordination of care   Caroleen Hamman, MD [place]

## 2019-12-21 NOTE — Patient Instructions (Signed)
Our office will contact you around August 2022 to schedule your mammogram and office visit for September 2022.  Call the office if you have any questions or concerns.   Breast Self-Awareness Breast self-awareness means being familiar with how your breasts look and feel. It involves checking your breasts regularly and reporting any changes to your health care provider. Practicing breast self-awareness is important. Sometimes changes may not be harmful (are benign), but sometimes a change in your breasts can be a sign of a serious medical problem. It is important to learn how to do this procedure correctly so that you can catch problems early, when treatment is more likely to be successful. All women should practice breast self-awareness, including women who have had breast implants. What you need:  A mirror.  A well-lit room. How to do a breast self-exam A breast self-exam is one way to learn what is normal for your breasts and whether your breasts are changing. To do a breast self-exam: Look for changes  1. Remove all the clothing above your waist. 2. Stand in front of a mirror in a room with good lighting. 3. Put your hands on your hips. 4. Push your hands firmly downward. 5. Compare your breasts in the mirror. Look for differences between them (asymmetry), such as: ? Differences in shape. ? Differences in size. ? Puckers, dips, and bumps in one breast and not the other. 6. Look at each breast for changes in the skin, such as: ? Redness. ? Scaly areas. 7. Look for changes in your nipples, such as: ? Discharge. ? Bleeding. ? Dimpling. ? Redness. ? A change in position. Feel for changes Carefully feel your breasts for lumps and changes. It is best to do this while lying on your back on the floor, and again while sitting or standing in the tub or shower with soapy water on your skin. Feel each breast in the following way: 1. Place the arm on the side of the breast you are examining  above your head. 2. Feel your breast with the other hand. 3. Start in the nipple area and make -inch (2 cm) overlapping circles to feel your breast. Use the pads of your three middle fingers to do this. Apply light pressure, then medium pressure, then firm pressure. The light pressure will allow you to feel the tissue closest to the skin. The medium pressure will allow you to feel the tissue that is a little deeper. The firm pressure will allow you to feel the tissue close to the ribs. 4. Continue the overlapping circles, moving downward over the breast until you feel your ribs below your breast. 5. Move one finger-width toward the center of the body. Continue to use the -inch (2 cm) overlapping circles to feel your breast as you move slowly up toward your collarbone. 6. Continue the up-and-down exam using all three pressures until you reach your armpit.  Write down what you find Writing down what you find can help you remember what to discuss with your health care provider. Write down:  What is normal for each breast.  Any changes that you find in each breast, including: ? The kind of changes you find. ? Any pain or tenderness. ? Size and location of any lumps.  Where you are in your menstrual cycle, if you are still menstruating. General tips and recommendations  Examine your breasts every month.  If you are breastfeeding, the best time to examine your breasts is after a feeding or after  using a breast pump.  If you menstruate, the best time to examine your breasts is 5-7 days after your period. Breasts are generally lumpier during menstrual periods, and it may be more difficult to notice changes.  With time and practice, you will become more familiar with the variations in your breasts and more comfortable with the exam. Contact a health care provider if you:  See a change in the shape or size of your breasts or nipples.  See a change in the skin of your breast or nipples, such as  a reddened or scaly area.  Have unusual discharge from your nipples.  Find a lump or thick area that was not there before.  Have pain in your breasts.  Have any concerns related to your breast health. Summary  Breast self-awareness includes looking for physical changes in your breasts, as well as feeling for any changes within your breasts.  Breast self-awareness should be performed in front of a mirror in a well-lit room.  You should examine your breasts every month. If you menstruate, the best time to examine your breasts is 5-7 days after your menstrual period.  Let your health care provider know of any changes you notice in your breasts, including changes in size, changes on the skin, pain or tenderness, or unusual fluid from your nipples. This information is not intended to replace advice given to you by your health care provider. Make sure you discuss any questions you have with your health care provider. Document Revised: 11/17/2017 Document Reviewed: 11/17/2017 Elsevier Patient Education  [place].

## 2019-12-23 ENCOUNTER — Encounter: Payer: Self-pay | Admitting: Obstetrics & Gynecology

## 2019-12-23 ENCOUNTER — Other Ambulatory Visit: Payer: Self-pay

## 2019-12-23 ENCOUNTER — Ambulatory Visit (INDEPENDENT_AMBULATORY_CARE_PROVIDER_SITE_OTHER): Payer: Managed Care, Other (non HMO) | Admitting: Obstetrics & Gynecology

## 2019-12-23 VITALS — BP 120/80 | Ht 64.0 in | Wt 177.0 lb

## 2019-12-23 DIAGNOSIS — N9419 Other specified dyspareunia: Secondary | ICD-10-CM | POA: Diagnosis not present

## 2019-12-23 DIAGNOSIS — N3000 Acute cystitis without hematuria: Secondary | ICD-10-CM | POA: Diagnosis not present

## 2019-12-23 DIAGNOSIS — Z01419 Encounter for gynecological examination (general) (routine) without abnormal findings: Secondary | ICD-10-CM

## 2019-12-23 DIAGNOSIS — Z1211 Encounter for screening for malignant neoplasm of colon: Secondary | ICD-10-CM | POA: Diagnosis not present

## 2019-12-23 LAB — POCT URINALYSIS DIPSTICK
Bilirubin, UA: NEGATIVE
Blood, UA: NEGATIVE
Glucose, UA: NEGATIVE
Ketones, UA: NEGATIVE
Nitrite, UA: NEGATIVE
Protein, UA: NEGATIVE
Spec Grav, UA: 1.01 (ref 1.010–1.025)
Urobilinogen, UA: 0.2 E.U./dL
pH, UA: 5 (ref 5.0–8.0)

## 2019-12-23 MED ORDER — REPLENS VA GEL: 1.0000 | VAGINAL | 11 refills | Status: DC

## 2019-12-23 NOTE — Progress Notes (Signed)
HPI:      [name] is a 59 y.o. G1P1001 who LMP was in the past, she presents today for her annual examination. The patient is sexually active and c/o vaginal pain w intercourse.  Also still has the RLQ sensitivity pains.  Reports frequent UTIs, and has seen urology, currently has urine odor and frequency.  Occas hot flash, not a problem.  Herlast pap: approximate date 2018 and was normal and last mammogram: was normal.  The patient does perform self breast exams.  There is notable family history of breast or ovarian cancer in her family.  She herself has the Breast Cancer Gene abnormality. The patient is not taking hormone replacement therapy. Patient denies post-menopausal vaginal bleeding.   The patient has regular exercise: yes. The patient denies current symptoms of depression.    GYN Hx: Last Colonoscopy:6 years ago. Normal.  Last DEXA: never ago.    PMHx: Past Medical History:  Diagnosis Date  . Abnormal Pap smear of cervix   . Arthritis    hips   . BRCA negative 02/04/2012   Sample completed by [facility]. Performing laboratory: [facility]. Assession #:[id number] LD  . Cystitis   . Family history of adverse reaction to anesthesia    pts sisters bp drops with epi- pt collapsed after colonoscopy  . Family history of breast cancer   . GERD (gastroesophageal reflux disease)    occ-tums prn  . Headache    h/o migraines  . History of kidney stones    h/o  . Increased risk of breast cancer 2013, 2019   riskscore=28.6% due to FH (not BRIP1 mutation)  . 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 UTI (urinary tract infection)    Past Surgical History:  Procedure Laterality Date  . APPENDECTOMY    . BREAST BIOPSY Right 07/06/2001   Stereotactic biopsy showing fibrocystic changes, apocrine metaplasia and ductal adenosis, ductal epithelial hyperplasia without atypia.  Marland Kitchen BREAST BIOPSY Right 11/10/2016   A.  sclerosing adenosis, dicordant per radiologist x marker  . BREAST BIOPSY Right 12/08/2018   MRI bx, dumbell marker, CSL  . BREAST CYST ASPIRATION Left 1994  . BREAST LUMPECTOMY Right 02/24/2019   CSL  . BREAST LUMPECTOMY WITH NEEDLE LOCALIZATION Right 02/24/2019   Procedure: BREAST LUMPECTOMY WITH NEEDLE LOCALIZATION;  Surgeon: Leafy Ro, MD;  Location: [facility] ORS;  Service: General;  Laterality: Right;  . COLONOSCOPY N/A 04/02/2015   Procedure: COLONOSCOPY;  Surgeon: Scot Jun, MD;  Location: [facility] ENDOSCOPY;  Service: Endoscopy;  Laterality: N/A;  . CYSTOSCOPY N/A 05/06/2018   Procedure: CYSTOSCOPY;  Surgeon: Nadara Mustard, MD;  Location: [facility] ORS;  Service: Gynecology;  Laterality: N/A;  . TONSILLECTOMY     Family History  Problem Relation Age of Onset  . Breast cancer Mother 51  . Bone cancer Mother   . Diabetes Mother   . Breast cancer Maternal Aunt        post menopausal   . Heart disease Father   . Thyroid disease Father   . Colon polyps Sister   . Cancer Sister   . Breast cancer Maternal Aunt        post menopausal   . Ovarian cancer Maternal Aunt        post menopausal    Social History   Tobacco Use  . Smoking status: Never Smoker  . Smokeless tobacco: Never Used  Vaping Use  .  Vaping Use: Never used  Substance Use Topics  . Alcohol use: No    Alcohol/week: 0.0 standard drinks  . Drug use: No    Current Outpatient Medications:  .  calcium carbonate (TUMS - DOSED IN MG ELEMENTAL CALCIUM) 500 MG chewable tablet, Chew 1 tablet by mouth daily as needed for indigestion or heartburn. , Disp: , Rfl:  .  ibuprofen (ADVIL,MOTRIN) 200 MG tablet, Take 600 mg by mouth every 6 (six) hours as needed for headache or moderate pain. , Disp: , Rfl:  .  Multiple Vitamins-Calcium (ONE-A-DAY WOMENS PO), Take 1 tablet by mouth daily. , Disp: , Rfl:  .  [START ON 12/26/2019] Vaginal Lubricant (REPLENS) GEL, Place 1 Applicatorful vaginally 2 (two) times a week., Disp: 35 g,  Rfl: 11 Allergies: Doxycycline, Phenergan [promethazine hcl], Tramadol, Keflex [cephalexin], Ketek [telithromycin], Levaquin [levofloxacin in d5w], Nitrofurantoin monohyd macro, Other, Oxycodone, and Parafon forte dsc [chlorzoxazone]  Review of Systems  Constitutional: Negative for chills, fever and malaise/fatigue.  HENT: Negative for congestion, sinus pain and sore throat.   Eyes: Negative for blurred vision and pain.  Respiratory: Negative for cough and wheezing.   Cardiovascular: Negative for chest pain and leg swelling.  Gastrointestinal: Negative for abdominal pain, constipation, diarrhea, heartburn, nausea and vomiting.  Genitourinary: Positive for frequency. Negative for dysuria, hematuria and urgency.  Musculoskeletal: Positive for joint pain. Negative for back pain, myalgias and neck pain.  Skin: Negative for itching and rash.  Neurological: Negative for dizziness, tremors and weakness.  Endo/Heme/Allergies: Does not bruise/bleed easily.  Psychiatric/Behavioral: Negative for depression. The patient is not nervous/anxious and does not have insomnia.     Objective: BP 120/80   Ht $R'5\' 4"'Ka$  (1.626 m)   Wt 177 lb (80.3 kg)   LMP  (LMP Unknown) Comment: pt negative for BRCA Gene for breast ca but positive ovarian ca. Complete hysterectomy done 04/2018  BMI 30.38 kg/m   Filed Weights   12/23/19 0758  Weight: 177 lb (80.3 kg)   Body mass index is 30.38 kg/m. Physical Exam Constitutional:      General: She is not in acute distress.    Appearance: She is well-developed.  Genitourinary:     Pelvic exam was performed with patient supine.     Vulva, urethra, bladder, vagina and rectum normal.     No lesions in the vagina.     No vaginal bleeding.     Cervix is absent.     Uterus is absent.     No right or left adnexal mass present.     Right adnexa not tender.     Left adnexa not tender.     Genitourinary Comments: Vaginal cuff well healed No mass or lesion Min atrophy noted    HENT:     Head: Normocephalic and atraumatic. No laceration.     Right Ear: Hearing normal.     Left Ear: Hearing normal.     Mouth/Throat:     Pharynx: Uvula midline.  Eyes:     Pupils: Pupils are equal, round, and reactive to light.  Neck:     Thyroid: No thyromegaly.  Cardiovascular:     Rate and Rhythm: Normal rate and regular rhythm.     Heart sounds: No murmur heard.  No friction rub. No gallop.   Pulmonary:     Effort: Pulmonary effort is normal. No respiratory distress.     Breath sounds: Normal breath sounds. No wheezing.  Chest:     Breasts:  Right: No mass, skin change or tenderness.        Left: No mass, skin change or tenderness.  Abdominal:     General: Bowel sounds are normal. There is no distension.     Palpations: Abdomen is soft.     Tenderness: There is no abdominal tenderness. There is no rebound.  Musculoskeletal:        General: Normal range of motion.     Cervical back: Normal range of motion and neck supple.  Neurological:     Mental Status: She is alert and oriented to person, place, and time.     Cranial Nerves: No cranial nerve deficit.  Skin:    General: Skin is warm and dry.  Psychiatric:        Judgment: Judgment normal.  Vitals reviewed.    UA TRACE LEUK  Assessment: Annual Exam 1. Women's annual routine gynecological examination   2. Screen for colon cancer   3. Dyspareunia due to medical condition in female   4. Acute cystitis without hematuria     Plan:            1.  VaginalScreening-  Pap smear schedule reviewed with patient  2. Breast screening- Exam annually and mammogram scheduled  3. Colonoscopy every 10 years, Hemoccult testing after age 26  4. Labs managed by PCP  5. Counseling for hormonal therapy: none              6. FRAX - FRAX score for assessing the 10 year probability for fracture calculated and discussed today.  Based on age and score today, DEXA is not currently scheduled.   7. Send Urine Culture  to assess for UTI   8. Dyspareunia, vaginal.  Replens as first option, to address dryness.  PT/ Kegels also may help (she has done in past, may need to go back to PT for pelvic floor rehab).  Next option [place].  ERT not an option.    F/U  Return in about 1 year (around 12/22/2020) for Annual.  Barnett Applebaum, MD, [facility], [facility] 12/23/2019  8:27 AM

## 2019-12-23 NOTE — Patient Instructions (Signed)
Mammogram every year Colonoscopy every 10 years Labs yearly (with PCP)  Thank you for choosing [facility]. As part of our ongoing efforts to improve patient experience, we would appreciate your feedback. Please fill out the short survey that you will receive by mail or MyChart. Your opinion is important to [name]! - Dr. Kenton Kingfisher  Replens twice weekly for vaginal dryness and/or pain

## 2019-12-25 LAB — URINE CULTURE

## 2020-02-09 LAB — FECAL OCCULT BLOOD, IMMUNOCHEMICAL: Fecal Occult Bld: NEGATIVE

## 2020-02-22 ENCOUNTER — Other Ambulatory Visit: Payer: Self-pay

## 2020-02-22 ENCOUNTER — Ambulatory Visit (INDEPENDENT_AMBULATORY_CARE_PROVIDER_SITE_OTHER): Payer: Managed Care, Other (non HMO) | Admitting: Obstetrics & Gynecology

## 2020-02-22 ENCOUNTER — Encounter: Payer: Self-pay | Admitting: Obstetrics & Gynecology

## 2020-02-22 VITALS — Ht 64.0 in | Wt 165.0 lb

## 2020-02-22 DIAGNOSIS — N9419 Other specified dyspareunia: Secondary | ICD-10-CM

## 2020-02-22 NOTE — Progress Notes (Signed)
Virtual Visit via Telephone Note  I connected with Michelle Mejia on 02/22/20 at  8:20 AM EST by telephone and verified that I am speaking with the correct person using two identifiers.  Location: Patient: Home Provider: Office   I discussed the limitations, risks, security and privacy concerns of performing an evaluation and management service by telephone and the availability of in person appointments. I also discussed with the patient that there may be a patient responsible charge related to this service. The patient expressed understanding and agreed to proceed.   History of Present Illness:  Michelle Mejia is a 59 y.o. who was started on Replens  approximately 2 months ago. Since that time, she states that her symptoms show no change.  She has not used very much.  Occas spotting after sex as well.  PMHx: She  has a past medical history of Abnormal Pap smear of cervix, Arthritis, BRCA negative (02/04/2012), Cystitis, Family history of adverse reaction to anesthesia, Family history of breast cancer, GERD (gastroesophageal reflux disease), Headache, History of kidney stones, Increased risk of breast cancer (2013, 2025), Monoallelic mutation of BRIP1 gene (01/2018), and UTI (urinary tract infection). Also,  has a past surgical history that includes Appendectomy; Colonoscopy (N/A, 04/02/2015); Tonsillectomy; Breast cyst aspiration (Left, 1994); Cystoscopy (N/A, 05/06/2018); Breast biopsy (Right, 07/06/2001); Breast biopsy (Right, 11/10/2016); Breast biopsy (Right, 12/08/2018); Breast lumpectomy with needle localization (Right, 02/24/2019); and Breast lumpectomy (Right, 02/24/2019)., family history includes Bone cancer in her mother; Breast cancer in her maternal aunt and maternal aunt; Breast cancer (age of onset: 60) in her mother; Cancer in her sister; Colon polyps in her sister; Diabetes in her mother; Heart disease in her father; Ovarian cancer in her maternal aunt; Thyroid disease in her father.,   reports that she has never smoked. She has never used smokeless tobacco. She reports that she does not drink alcohol and does not use drugs. Current Meds  Medication Sig  . calcium carbonate (TUMS - DOSED IN MG ELEMENTAL CALCIUM) 500 MG chewable tablet Chew 1 tablet by mouth daily as needed for indigestion or heartburn.   Marland Kitchen ibuprofen (ADVIL,MOTRIN) 200 MG tablet Take 600 mg by mouth every 6 (six) hours as needed for headache or moderate pain.   . Multiple Vitamins-Calcium (ONE-A-DAY WOMENS PO) Take 1 tablet by mouth daily.   . Vaginal Lubricant (REPLENS) GEL Place 1 Applicatorful vaginally 2 (two) times a week.  . Also, is allergic to doxycycline, phenergan [promethazine hcl], tramadol, keflex [cephalexin], ketek [telithromycin], levaquin [levofloxacin in d5w], nitrofurantoin monohyd macro, other, oxycodone, and parafon forte dsc [chlorzoxazone]..  Review of Systems  All other systems reviewed and are negative.   Observations/Objective: No exam today, due to telephone eVisit due to [facility] virus restriction on elective visits and procedures.  Prior visits reviewed along with ultrasounds/labs as indicated.  Assessment and Plan:   ICD-10-CM   1. Dyspareunia due to medical condition in female  N94.19   Pt agrees to try Replens 2-3 times weekly more regularly for the next 2 mos. Options for PT also offered and will plan if no better. ERT not advised.  Follow Up Instructions: 2 mos if no better   I discussed the assessment and treatment plan with the patient. The patient was provided an opportunity to ask questions and all were answered. The patient agreed with the plan and demonstrated an understanding of the instructions.   The patient was advised to call back or seek an in-person evaluation if the symptoms worsen  or if the condition fails to improve as anticipated.  I provided 20 minutes of non-face-to-face time during this encounter.   Hoyt Koch, MD

## 2020-10-30 ENCOUNTER — Ambulatory Visit: Payer: Managed Care, Other (non HMO) | Admitting: Physician Assistant

## 2020-10-30 ENCOUNTER — Encounter: Payer: Self-pay | Admitting: Physician Assistant

## 2020-10-30 VITALS — BP 128/72 | HR 86 | Temp 98.0°F | Resp 16 | Ht 65.0 in | Wt 178.0 lb

## 2020-10-30 DIAGNOSIS — Z008 Encounter for other general examination: Secondary | ICD-10-CM

## 2020-10-30 DIAGNOSIS — Z0000 Encounter for general adult medical examination without abnormal findings: Secondary | ICD-10-CM | POA: Diagnosis not present

## 2020-10-30 NOTE — Progress Notes (Signed)
   Subjective:    Patient ID: Michelle Mejia, female    DOB: October 06, 1960, 60 y.o.   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- but enjoys it- except too much time at computer/desk  Some issues with sleep interruption and hot flashes, fatigue Persistent since 2020 Scope Assist Vag Hyst BSO ( Ovarian specific BRCA + evaluation) ( Breast  neg) Was PMP at time of surgery but symptoms reported as increased Has tried OTC supplements with some improvement Interested in additional support  Fam Hx BRCA 1-2- as noted Multiple breast  u/s, biopsies and aspirations Does SBE  Covid vaccines 1,2 - no booster-- discussed variants  Review of Systems Weight continues to be a problem- Exercise intervention not consistently established    Objective:   Physical Exam Vitals and nursing note reviewed.  Constitutional:      Appearance: Normal appearance.     Comments: overweight  HENT:     Head: Normocephalic and atraumatic.     Right Ear: Tympanic membrane, ear canal and external ear normal.     Left Ear: Tympanic membrane, ear canal and external ear normal.     Nose: Nose normal.     Mouth/Throat:     Mouth: Mucous membranes are moist.     Comments: DDS q 6 mos Eyes:     Extraocular Movements: Extraocular movements intact.  Cardiovascular:     Rate and Rhythm: Normal rate and regular rhythm.     Pulses: Normal pulses.  Pulmonary:     Effort: Pulmonary effort is normal.     Breath sounds: Normal breath sounds.  Abdominal:     Palpations: Abdomen is soft.     Tenderness: There is no abdominal tenderness. There is no guarding.  Genitourinary:    Comments: Defer to Gyn; does SBE Musculoskeletal:        General: Normal range of motion.     Cervical back: Normal range of motion and neck supple. No tenderness.  Lymphadenopathy:     Cervical: No cervical adenopathy.  Skin:    General: Skin is warm and dry.      Capillary Refill: Capillary refill takes less than 2 seconds.  Neurological:     General: No focal deficit present.     Mental Status: She is alert.     Cranial Nerves: No cranial nerve deficit.     Deep Tendon Reflexes: Reflexes normal.  Psychiatric:        Mood and Affect: Mood normal.        Behavior: Behavior normal.     Assessment & Plan:  Encourage continued consultation for vasomotor instability /support Sleep interventions-- consultations discussed Encourage consistent 30 minute brisk walk daily- for cardiovascular support, weight control and a break from the computer !! Will report labs as available- reports My Chart active Questions fielded, recommendations reviewed

## 2020-10-31 LAB — LIPID PANEL
Chol/HDL Ratio: 3.7 ratio (ref 0.0–4.4)
Cholesterol, Total: 197 mg/dL (ref 100–199)
HDL: 53 mg/dL (ref >39)
LDL Chol Calc (NIH): 132 mg/dL — ABNORMAL HIGH (ref 0–99)
Triglycerides: 63 mg/dL (ref 0–149)
VLDL Cholesterol Cal: 12 mg/dL (ref 5–40)

## 2020-10-31 LAB — GLUCOSE, RANDOM: Glucose: 93 mg/dL (ref 65–99)

## 2020-11-07 ENCOUNTER — Other Ambulatory Visit: Payer: Self-pay | Admitting: Surgery

## 2020-11-22 ENCOUNTER — Other Ambulatory Visit: Payer: Self-pay

## 2020-11-22 DIAGNOSIS — Z1231 Encounter for screening mammogram for malignant neoplasm of breast: Secondary | ICD-10-CM

## 2020-11-23 IMAGING — MR MR BILATERAL BREAST WITHOUT AND WITH CONTRAST
2 of 11 series · 6 of 48 positions shown · IV contrast (8ml Gadavist)
Comparison: Previous exam(s).

CLINICAL DATA: History of right breast distortion, post biopsy in
October 2016 with benign discordant pathology including sclerosing
adenosis, stromal fibrosis and small intraductal papilloma. Excision
was recommended but has not been performed yet.

The patient has a strong family history of breast cancer in mother
and 2 maternal aunts, and an increased lifetime risk of breast
cancer of 28.6%.
LABS:  None.
EXAM:
BILATERAL BREAST MRI WITH AND WITHOUT CONTRAST
TECHNIQUE: Multiplanar, multisequence MR images of both breasts were obtained
prior to and following the intravenous administration of 8 ml of
Gadavist

[Series 2: T1 · axial · B · 1.5mm · 1.08mm/px · z∈[-88,+90]mm · 5 of 120 slices shown]
[im 1/120]
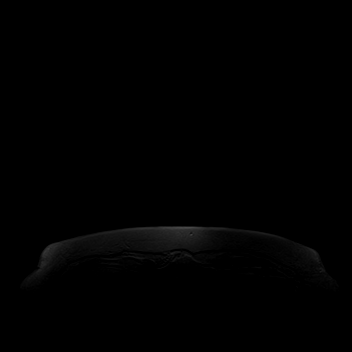
[im 30/120]
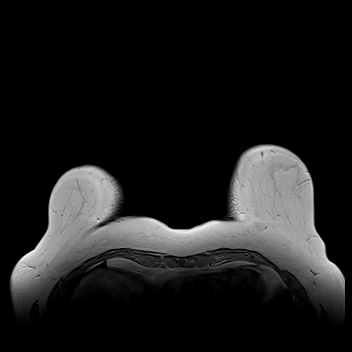
[im 60/120]
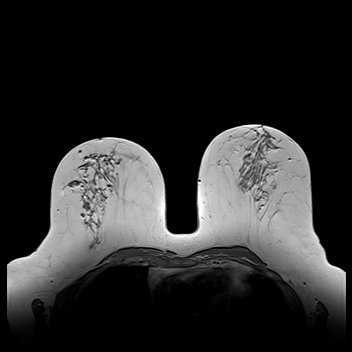
[im 90/120]
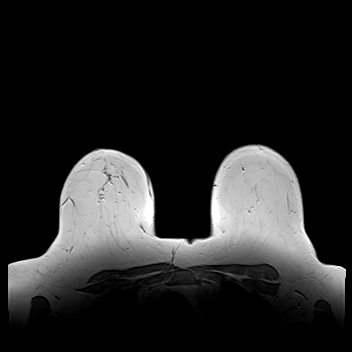
[im 120/120]
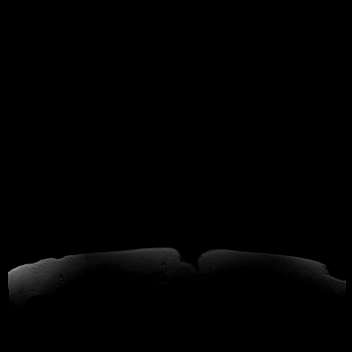

[Series 3: T2 · axial · B · 3.0mm · 1.08mm/px · 1 of 36 slices shown]
[im 1/36]
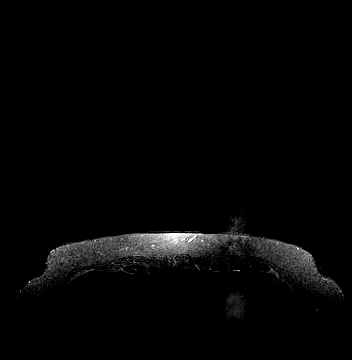

[6 of 48 positions shown; findings below may reference images not displayed]

Three-dimensional MR images were rendered by post-processing of the
original MR data on an independent workstation. The
three-dimensional MR images were interpreted, and findings are
reported in the following complete MRI report for this study. Three
dimensional images were evaluated at the independent [employer]
workstation
FINDINGS: Breast composition: c. Heterogeneous fibroglandular tissue.

Background parenchymal enhancement: Moderate.

Right breast: There is an area of architectural distortion in the
right breast lower outer quadrant, which likely corresponds to the
distortion seen mammographically in the right lateral breast, as
evident by the position of the 3 clip artifacts. Week progressive
enhancement is associated with this area of distortion, seen on
images 43 to 51/112. It measures approximately 2.2 x 2.0 x 1.8 cm.
More confluent nodular enhancement, with progressive kinetics, is
seen slightly superior/posterior to the distortion, still in the
right breast lower outer quadrant, measuring 2.1 x 0.8 cm, image
54/112. Collectively, both abnormalities measure approximately
cm.

Left breast: No mass or abnormal enhancement.

Lymph nodes: No abnormal appearing lymph nodes.

Ancillary findings:  None.
IMPRESSION: The right lateral breast area of architectural distortion previously
described mammographically localizes to the lower outer quadrant,
measuring approximately 2.2 x 2.0 x 1.8 cm, and demonstrates week
progressive enhancement.

2.2 cm area of confluent nodular enhancement is present slightly
superior to the area of distortion in the right breast lower outer
quadrant.

No evidence suspicious lymphadenopathy.

RECOMMENDATION:
Right breast MRI guided core needle biopsies of the more inferior
portion of the area of distortion, and the area of nodular
enhancement located superiorly to the distortion, to localize the
extent of the pathologic process, may be considered.

Alternatively, only 1 MRI guided core needle biopsy, of the area of
nodular enhancement, may be performed, followed by surgical excision
with bracketed (if needed) pre-surgical needle localization of both
areas.

BI-RADS CATEGORY  4: Suspicious.

## 2020-11-30 DIAGNOSIS — Z0000 Encounter for general adult medical examination without abnormal findings: Secondary | ICD-10-CM | POA: Insufficient documentation

## 2020-12-10 ENCOUNTER — Other Ambulatory Visit: Payer: Self-pay

## 2020-12-10 ENCOUNTER — Ambulatory Visit
Admission: RE | Admit: 2020-12-10 | Discharge: 2020-12-10 | Disposition: A | Discharge status: Home / Self Care | Payer: Managed Care, Other (non HMO) | Source: Ambulatory Visit | Attending: Surgery | Admitting: Surgery

## 2020-12-10 DIAGNOSIS — Z1231 Encounter for screening mammogram for malignant neoplasm of breast: Secondary | ICD-10-CM | POA: Diagnosis present

## 2020-12-11 DIAGNOSIS — R635 Abnormal weight gain: Secondary | ICD-10-CM | POA: Insufficient documentation

## 2020-12-11 DIAGNOSIS — R8290 Unspecified abnormal findings in urine: Secondary | ICD-10-CM | POA: Insufficient documentation

## 2020-12-11 DIAGNOSIS — N951 Menopausal and female climacteric states: Secondary | ICD-10-CM | POA: Insufficient documentation

## 2020-12-11 DIAGNOSIS — R4586 Emotional lability: Secondary | ICD-10-CM | POA: Insufficient documentation

## 2020-12-18 ENCOUNTER — Other Ambulatory Visit: Payer: Self-pay | Admitting: Surgery

## 2020-12-18 DIAGNOSIS — R921 Mammographic calcification found on diagnostic imaging of breast: Secondary | ICD-10-CM

## 2020-12-18 DIAGNOSIS — R928 Other abnormal and inconclusive findings on diagnostic imaging of breast: Secondary | ICD-10-CM

## 2020-12-24 ENCOUNTER — Ambulatory Visit
Admission: RE | Admit: 2020-12-24 | Discharge: 2020-12-24 | Disposition: A | Discharge status: Home / Self Care | Payer: Managed Care, Other (non HMO) | Source: Ambulatory Visit | Attending: Surgery | Admitting: Surgery

## 2020-12-24 ENCOUNTER — Other Ambulatory Visit: Payer: Self-pay

## 2020-12-24 ENCOUNTER — Ambulatory Visit: Payer: Managed Care, Other (non HMO) | Admitting: Surgery

## 2020-12-24 DIAGNOSIS — R928 Other abnormal and inconclusive findings on diagnostic imaging of breast: Secondary | ICD-10-CM | POA: Insufficient documentation

## 2020-12-24 DIAGNOSIS — R921 Mammographic calcification found on diagnostic imaging of breast: Secondary | ICD-10-CM | POA: Insufficient documentation

## 2020-12-31 ENCOUNTER — Encounter: Payer: Self-pay | Admitting: Surgery

## 2020-12-31 ENCOUNTER — Other Ambulatory Visit: Payer: Self-pay

## 2020-12-31 ENCOUNTER — Ambulatory Visit: Payer: Managed Care, Other (non HMO) | Admitting: Surgery

## 2020-12-31 VITALS — BP 133/84 | HR 81 | Temp 98.7°F | Ht 64.0 in | Wt 179.6 lb

## 2020-12-31 DIAGNOSIS — Z1231 Encounter for screening mammogram for malignant neoplasm of breast: Secondary | ICD-10-CM | POA: Diagnosis not present

## 2020-12-31 NOTE — Patient Instructions (Addendum)
We will contact you to schedule your mammogram and follow up appointment with Dr.Pabon for march 2023. Please call if you have any questions or concerns.

## 2020-12-31 NOTE — Progress Notes (Signed)
HPI: Michelle Mejia is a 60 year old female status post excisional breast biopsy of the right side showing BENIGN MAMMARY PARENCHYMA WITH COMPLEX SCLEROSING LESION INCLUDING PAPILLOMA  She has recovered well from the surgery and now is following up for yearly mammogram.  She denies any symptoms related to any of her breast.  No lumps, no bumps no breast discharge or pain.  She did have a mammogram that I have personally reviewed showingsome calcification at the margin of resection. No masses. Recommend f/u 6 months.        Past Medical History:  Diagnosis Date   Abnormal Pap smear of cervix     Arthritis      hips    BRCA negative 02/04/2012    Sample completed by [facility]. Performing laboratory: [facility]. 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   BREAST BIOPSY Right 11/10/2016    A. sclerosing adenosis, dicordant per radiologist x marker   BREAST BIOPSY Right 12/08/2018    MRI bx, dumbell marker, CSL   BREAST CYST ASPIRATION Left 1994   BREAST LUMPECTOMY Right 02/24/2019    CSL   BREAST LUMPECTOMY WITH NEEDLE LOCALIZATION Right 02/24/2019    Procedure: BREAST LUMPECTOMY WITH NEEDLE LOCALIZATION;  Surgeon: Jules Husbands, MD;   Location: [facility] ORS;  Service: General;  Laterality: Right;   COLONOSCOPY N/A 04/02/2015    Procedure: COLONOSCOPY;  Surgeon: Manya Silvas, MD;  Location: [facility] ENDOSCOPY;  Service: Endoscopy;  Laterality: N/A;   CYSTOSCOPY N/A 05/06/2018    Procedure: CYSTOSCOPY;  Surgeon: Gae Dry, MD;  Location: [facility] ORS;  Service: Gynecology;  Laterality: N/A;   TONSILLECTOMY               Family History  Problem Relation Age of Onset   Breast cancer Mother 14   Bone cancer Mother     Diabetes Mother     Breast cancer Maternal Aunt          post menopausal    Heart disease Father     Thyroid disease Father     Colon polyps Sister     Cancer Sister     Breast cancer Maternal Aunt          post menopausal    Ovarian cancer Maternal Aunt          post menopausal       Social History:  reports that she has never smoked. She has never used smokeless tobacco. She reports that she does not drink alcohol and does not use drugs.   Allergies:  Allergies  Allergen Reactions   Doxycycline Nausea And Vomiting and Other (See Comments)      dizzy   Phenergan [Promethazine Hcl] Nausea And Vomiting   Tramadol Nausea And Vomiting and Other (See Comments)      dizzy   Keflex [Cephalexin] Rash   Ketek [Telithromycin] Itching and Rash   Levaquin [Levofloxacin In D5w] Rash   Nitrofurantoin Monohyd Macro Rash   Other Rash      Muscle relaxer medication but not flexeril or robaxin pt unsure of the name.    Oxycodone Rash   Parafon Forte Dsc [Chlorzoxazone] Rash      Medications reviewed.       ROS Full ROS performed and is otherwise negative other than what is stated in HPI      Physical Exam Vitals and nursing note reviewed. Exam conducted with a chaperone present.  Constitutional:      General: She is not in acute distress.    Appearance: Normal appearance. She is normal weight.  Cardiovascular:     Rate and Rhythm: Normal rate and regular rhythm.  Pulmonary:     Effort:  Pulmonary effort is normal. No respiratory distress.     Breath sounds: Normal breath sounds. No stridor.     Comments: Right lumpectomy scar with good breast contour, no deformity Abdominal:     General: Abdomen is flat. There is no distension.     Palpations: Abdomen is soft. There is no mass.     Tenderness: There is no abdominal tenderness. There is no guarding or rebound.     Hernia: No hernia is present.  Musculoskeletal:     Cervical back: Normal range of motion and neck supple. No rigidity or tenderness.  Lymphadenopathy:     Cervical: No cervical adenopathy.  Skin:    General: Skin is warm and dry.     Capillary Refill: Capillary refill takes less than 2 seconds.  Neurological:     General: No focal deficit present.     Mental Status: She is alert and oriented to person, place, and time.  Psychiatric:        Mood and Affect: Mood normal.        Behavior: Behavior normal.        Thought Content: Thought content normal.        Judgment: Judgment normal.      Assessment/Plan: History of benign breast biopsy on the right side  no evidence of suspicious lesions on clinical exam , some calcification on Mammo that will merit re-evaluation in 6 months.  .  No need for any further interventions. Counseling provided and questions answered Greater than 50% of the 30 minutes  visit was spent in counseling/coordination of care     Caroleen Hamman, MD [place] Surgeon

## 2021-06-10 ENCOUNTER — Other Ambulatory Visit: Payer: Self-pay

## 2021-06-10 DIAGNOSIS — N6489 Other specified disorders of breast: Secondary | ICD-10-CM

## 2021-06-10 DIAGNOSIS — R928 Other abnormal and inconclusive findings on diagnostic imaging of breast: Secondary | ICD-10-CM

## 2021-06-25 ENCOUNTER — Ambulatory Visit
Admission: RE | Admit: 2021-06-25 | Discharge: 2021-06-25 | Disposition: A | Discharge status: Home / Self Care | Payer: Managed Care, Other (non HMO) | Source: Ambulatory Visit | Attending: Surgery | Admitting: Surgery

## 2021-06-25 ENCOUNTER — Other Ambulatory Visit: Payer: Self-pay

## 2021-06-25 DIAGNOSIS — N6489 Other specified disorders of breast: Secondary | ICD-10-CM | POA: Diagnosis present

## 2021-06-25 DIAGNOSIS — R928 Other abnormal and inconclusive findings on diagnostic imaging of breast: Secondary | ICD-10-CM

## 2021-06-25 NOTE — Progress Notes (Signed)
06/25/21: ?Mammogram results viewed personally and released to the patient today via [facility].  Overall, right breast shows stable calcifications, probably benign.  No biopsy is needed for now and next mammogram will be August 2023. ? ?Olean Ree, MD

## 2021-07-03 ENCOUNTER — Ambulatory Visit: Payer: Managed Care, Other (non HMO) | Admitting: Surgery

## 2021-07-10 ENCOUNTER — Ambulatory Visit: Payer: Managed Care, Other (non HMO) | Admitting: Surgery

## 2021-07-10 ENCOUNTER — Encounter: Payer: Self-pay | Admitting: Surgery

## 2021-07-10 ENCOUNTER — Other Ambulatory Visit: Payer: Self-pay

## 2021-07-10 VITALS — BP 126/68 | HR 86 | Temp 98.3°F | Ht 64.0 in | Wt 185.0 lb

## 2021-07-10 DIAGNOSIS — N6489 Other specified disorders of breast: Secondary | ICD-10-CM

## 2021-07-10 NOTE — Patient Instructions (Addendum)
Follow up in August with your mammogram prior. We will send you a letter about these appointments.  ? ?Continue self breast exams. Call office for any new breast issues or concerns. ? ? ?Breast Self-Awareness ?Breast self-awareness means being familiar with how your breasts look and feel. It involves checking your breasts regularly and reporting any changes to your health care provider. ?Practicing breast self-awareness is important. Sometimes changes may not be harmful (are benign), but sometimes a change in your breasts can be a sign of a serious medical problem. It is important to learn how to do this procedure correctly so that you can catch problems early, when treatment is more likely to be successful. All women should practice breast self-awareness, including women who have had breast implants. ?What you need: ?A mirror. ?A well-lit room. ?How to do a breast self-exam ?A breast self-exam is one way to learn what is normal for your breasts and whether your breasts are changing. To do a breast self-exam: ?Look for changes ? ?Remove all the clothing above your waist. ?Stand in front of a mirror in a room with good lighting. ?Put your hands on your hips. ?Push your hands firmly downward. ?Compare your breasts in the mirror. Look for differences between them (asymmetry), such as: ?Differences in shape. ?Differences in size. ?Puckers, dips, and bumps in one breast and not the other. ?Look at each breast for changes in the skin, such as: ?Redness. ?Scaly areas. ?Look for changes in your nipples, such as: ?Discharge. ?Bleeding. ?Dimpling. ?Redness. ?A change in position. ?Feel for changes ?Carefully feel your breasts for lumps and changes. It is best to do this while lying on your back on the floor, and again while sitting or standing in the tub or shower with soapy water on your skin. Feel each breast in the following way: ?Place the arm on the side of the breast you are examining above your head. ?Feel your breast  with the other hand. ?Start in the nipple area and make ?-inch (2 cm) overlapping circles to feel your breast. Use the pads of your three middle fingers to do this. Apply light pressure, then medium pressure, then firm pressure. The light pressure will allow you to feel the tissue closest to the skin. The medium pressure will allow you to feel the tissue that is a little deeper. The firm pressure will allow you to feel the tissue close to the ribs. ?Continue the overlapping circles, moving downward over the breast until you feel your ribs below your breast. ?Move one finger-width toward the center of the body. Continue to use the ?-inch (2 cm) overlapping circles to feel your breast as you move slowly up toward your collarbone. ?Continue the up-and-down exam using all three pressures until you reach your armpit. ? ?Write down what you find ?Writing down what you find can help you remember what to discuss with your health care provider. Write down: ?What is normal for each breast. ?Any changes that you find in each breast, including: ?The kind of changes you find. ?Any pain or tenderness. ?Size and location of any lumps. ?Where you are in your menstrual cycle, if you are still menstruating. ?General tips and recommendations ?Examine your breasts every month. ?If you are breastfeeding, the best time to examine your breasts is after a feeding or after using a breast pump. ?If you menstruate, the best time to examine your breasts is 5-7 days after your period. Breasts are generally lumpier during menstrual periods, and it may  be more difficult to notice changes. ?With time and practice, you will become more familiar with the variations in your breasts and more comfortable with the exam. ?Contact a health care provider if you: ?See a change in the shape or size of your breasts or nipples. ?See a change in the skin of your breast or nipples, such as a reddened or scaly area. ?Have unusual discharge from your  nipples. ?Find a lump or thick area that was not there before. ?Have pain in your breasts. ?Have any concerns related to your breast health. ?Summary ?Breast self-awareness includes looking for physical changes in your breasts, as well as feeling for any changes within your breasts. ?Breast self-awareness should be performed in front of a mirror in a well-lit room. ?You should examine your breasts every month. If you menstruate, the best time to examine your breasts is 5-7 days after your menstrual period. ?Let your health care provider know of any changes you notice in your breasts, including changes in size, changes on the skin, pain or tenderness, or unusual fluid from your nipples. ?This information is not intended to replace advice given to you by your health care provider. Make sure you discuss any questions you have with your health care provider. ?Document Revised: 11/17/2017 Document Reviewed: 11/17/2017 ?Elsevier Patient Education ? [place]. ? ?

## 2021-07-12 ENCOUNTER — Encounter: Payer: Self-pay | Admitting: Surgery

## 2021-07-12 NOTE — Progress Notes (Signed)
Outpatient Surgical Follow Up ? ?07/12/2021 ? ?Michelle Mejia is an 61 y.o. female.  ? ?Chief Complaint  ?Patient presents with  ? Follow-up  ? ? ?HPI: Michelle Mejia  is a 61 year old female status post excisional breast biopsy of the right side 02/2019 showing COMPLEX SCLEROSING LESION INCLUDING PAPILLOMA. ?She has no complaints today. No masses, no breast DC. ?She did have a mammogram that I have personally reviewed showing some calcification near prior lumpectomy. No masses. Recommend f/u 6 months. ? ?Past Medical History:  ?Diagnosis Date  ? Abnormal Pap smear of cervix   ? Arthritis   ? hips   ? BRCA negative 02/04/2012  ? Sample completed by [facility]. Performing laboratory: [facility]. Assession #:[id number] LD  ? Cystitis   ? Family history of adverse reaction to anesthesia   ? pts sisters bp drops with epi- pt collapsed after colonoscopy  ? Family history of breast cancer   ? GERD (gastroesophageal reflux disease)   ? occ-tums prn  ? Headache   ? h/o migraines  ? History of kidney stones   ? h/o  ? Increased risk of breast cancer 2013, 2019  ? riskscore=28.6% due to [place] (not BRIP1 mutation)  ? 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 [facility] MyRisk postive; increased risk of ovar cancer  ? UTI (urinary tract infection)   ? ? ?Past Surgical History:  ?Procedure Laterality Date  ? APPENDECTOMY    ? BREAST BIOPSY Right 07/06/2001  ? Stereotactic biopsy showing fibrocystic changes, apocrine metaplasia and ductal adenosis, ductal epithelial hyperplasia without atypia.  ? BREAST BIOPSY Right 11/10/2016  ? A. sclerosing adenosis, dicordant per radiologist x marker  ? BREAST BIOPSY Right 12/08/2018  ? MRI bx, dumbell marker, CSL  ? BREAST CYST ASPIRATION Left 1994  ? BREAST LUMPECTOMY Right 02/24/2019  ? CSL  ? BREAST LUMPECTOMY WITH NEEDLE LOCALIZATION Right 02/24/2019  ? Procedure: BREAST LUMPECTOMY WITH NEEDLE LOCALIZATION;  Surgeon: Jules Husbands, MD;  Location: [facility] ORS;  Service: General;  Laterality: Right;  ?  COLONOSCOPY N/A 04/02/2015  ? Procedure: COLONOSCOPY;  Surgeon: Manya Silvas, MD;  Location: [facility] ENDOSCOPY;  Service: Endoscopy;  Laterality: N/A;  ? CYSTOSCOPY N/A 05/06/2018  ? Procedure: CYSTOSCOPY;  Surgeon: Gae Dry, MD;  Location: [facility] ORS;  Service: Gynecology;  Laterality: N/A;  ? TONSILLECTOMY    ? ? ?Family History  ?Problem Relation Age of Onset  ? Breast cancer Mother 46  ? Bone cancer Mother   ? Diabetes Mother   ? Breast cancer Maternal Aunt   ?     post menopausal   ? Heart disease Father   ? Thyroid disease Father   ? Colon polyps Sister   ? Cancer Sister   ? Breast cancer Maternal Aunt   ?     post menopausal   ? Ovarian cancer Maternal Aunt   ?     post menopausal   ? ? ?Social History:  reports that she has never smoked. She has been exposed to tobacco smoke. She has never used smokeless tobacco. She reports that she does not drink alcohol and does not use drugs. ? ?Allergies:  ?Allergies  ?Allergen Reactions  ? Doxycycline Nausea And Vomiting and Other (See Comments)  ?  dizzy  ? Phenergan [Promethazine Hcl] Nausea And Vomiting  ? Tramadol Nausea And Vomiting and Other (See Comments)  ?  dizzy  ? Keflex [Cephalexin] Rash  ? Ketek [Telithromycin] Itching and Rash  ? Levaquin [Levofloxacin In  D5w] Rash  ? Nitrofurantoin Monohyd Macro Rash  ? Other Rash  ?  Muscle relaxer medication but not flexeril or robaxin pt unsure of the name.   ? Oxycodone Rash  ? Parafon Forte Dsc [Chlorzoxazone] Rash  ? ? ?Medications reviewed. ? ? ? ?ROS ?Full ROS performed and is otherwise negative other than what is stated in HPI ? ? ?BP 126/68   Pulse 86   Temp 98.3 ?F (36.8 ?C)   Ht $R'5\' 4"'Ko$  (1.626 m)   Wt 185 lb (83.9 kg)   LMP  (LMP Unknown) Comment: pt negative for BRCA Gene for breast ca but positive ovarian ca. Complete hysterectomy done 04/2018  SpO2 93%   BMI 31.76 kg/m?  ? ?Physical Exam ?Physical Exam ?Vitals and nursing note reviewed. Exam conducted with a chaperone present.  ?Constitutional:    ?   General: She is not in acute distress. ?   Appearance: Normal appearance. She is normal weight.  ?Cardiovascular:  ?   Rate and Rhythm: Normal rate and regular rhythm.  ?Pulmonary:  ?   Effort: Pulmonary effort is normal. No respiratory distress.  ?   Breath sounds: Normal breath sounds. No stridor.  ?   Comments: Right lumpectomy scar with good breast contour, no deformity ?Abdominal:  ?   General: Abdomen is flat. There is no distension.  ?   Palpations: Abdomen is soft. There is no mass.  ?   Tenderness: There is no abdominal tenderness. There is no guarding or rebound.  ?   Hernia: No hernia is present.  ?Musculoskeletal:  ?   Cervical back: Normal range of motion and neck supple. No rigidity or tenderness.  ?Lymphadenopathy:  ?   Cervical: No cervical adenopathy.  ?Skin: ?   General: Skin is warm and dry.  ?   Capillary Refill: Capillary refill takes less than 2 seconds.  ?Neurological:  ?   General: No focal deficit present.  ?   Mental Status: She is alert and oriented to person, place, and time.  ?Psychiatric:     ?   Mood and Affect: Mood normal.     ?   Behavior: Behavior normal.     ?   Thought Content: Thought content normal.     ?   Judgment: Judgment normal.  ? ? ?Assessment/Plan: ?Right breast calcification after lumpectomy for benign disease. we will get 6 month mammo w mag. CLinincaly no evidence of suspicious lesions. Note that I spent > 30 min in this encounter including coordination  of care, placing orders, pers. Reviewing images and performing appropriate documentation ? ?Caroleen Hamman, MD FACS ?General Surgeon  ?

## 2021-09-23 ENCOUNTER — Other Ambulatory Visit: Payer: Self-pay

## 2021-09-23 DIAGNOSIS — Z1231 Encounter for screening mammogram for malignant neoplasm of breast: Secondary | ICD-10-CM

## 2021-09-28 NOTE — Progress Notes (Unsigned)
PCP: Gae Dry, MD   No chief complaint on file.   HPI:      Michelle Mejia is a 61 y.o. G1P1001 whose LMP was No LMP recorded (lmp unknown). Patient has had a hysterectomy., presents today for her annual examination.  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 No PMB.   Sex activity: {sex active: 315163}. She {does:18564} have vaginal dryness.  Last Pap: {GNOI:370488891}  Results were: {norm/abn:16707::"no abnormalities"} /neg HPV DNA.  Hx of STDs: {STD hx:14358}  Last mammogram: 12/10/20 Results were: cat 3 RT breast with calcifications; stable f/u mammo 3/23, annual mammo due 8/23 There is no FH of breast cancer. There is no FH of ovarian cancer. The patient {does:18564} do self-breast exams. Pt is BRIP1 posiive  Colonoscopy: {hx:15363}  Repeat due after 10*** years. ;;  Tobacco use: {tob:20664} Alcohol use: {Alcohol:11675} No drug use Exercise: {exercise:31265}  She {does:18564} get adequate calcium and Vitamin D in her diet.  Labs with PCP.   Patient Active Problem List   Diagnosis Date Noted   Fibroid 02/17/2018   Abnormal genetic test 02/15/2018   History of uterine fibroid 02/15/2018   High risk of ovarian cancer 01/19/2018   Family history of breast cancer 01/19/2018   Atypical chest pain 02/16/2017   SOB (shortness of breath) on exertion 02/16/2017   Abnormal mammogram of right breast 11/06/2016   Joint pain 01/13/2015    Past Surgical History:  Procedure Laterality Date   APPENDECTOMY     BREAST BIOPSY Right 07/06/2001   Stereotactic biopsy showing fibrocystic changes, apocrine metaplasia and ductal adenosis, ductal epithelial hyperplasia without atypia.   BREAST BIOPSY Right 11/10/2016   A. sclerosing adenosis, dicordant per radiologist x marker   BREAST BIOPSY Right 12/08/2018   MRI bx, dumbell marker, CSL   BREAST CYST ASPIRATION Left 1994   BREAST  LUMPECTOMY Right 02/24/2019   CSL   BREAST LUMPECTOMY WITH NEEDLE LOCALIZATION Right 02/24/2019   Procedure: BREAST LUMPECTOMY WITH NEEDLE LOCALIZATION;  Surgeon: Jules Husbands, MD;  Location: [facility] ORS;  Service: General;  Laterality: Right;   COLONOSCOPY N/A 04/02/2015   Procedure: COLONOSCOPY;  Surgeon: Manya Silvas, MD;  Location: [facility] ENDOSCOPY;  Service: Endoscopy;  Laterality: N/A;   CYSTOSCOPY N/A 05/06/2018   Procedure: CYSTOSCOPY;  Surgeon: Gae Dry, MD;  Location: [facility] ORS;  Service: Gynecology;  Laterality: N/A;   TONSILLECTOMY      Family History  Problem Relation Age of Onset   Breast cancer Mother 70   Bone cancer Mother    Diabetes Mother    Breast cancer Maternal Aunt        post menopausal    Heart disease Father    Thyroid disease Father    Colon polyps Sister    Cancer Sister    Breast cancer Maternal Aunt        post menopausal    Ovarian cancer Maternal Aunt        post menopausal     Social History   Socioeconomic History   Marital status: Married    Spouse name: Lennette Bihari   Number of children: 1   Years of education: 13   Highest education level: Some college, no degree  Occupational History   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 She lives with her husband in a one level home. She does not exercise.   Social Determinants of Health   Financial Resource Strain: Not on file  Food Insecurity: Not on file  Transportation Needs: Not on file  Physical Activity: Not on file  Stress: Not on file  Social Connections: Not on file  Intimate Partner  Violence: Not on file     Current Outpatient Medications:    calcium carbonate (TUMS - DOSED IN MG ELEMENTAL CALCIUM) 500 MG chewable tablet, Chew 1 tablet by mouth daily as needed for indigestion or heartburn. , Disp: , Rfl:    DULoxetine (CYMBALTA) 60 MG capsule, 1 capsule Orally Once a day, Disp: , Rfl:    ibuprofen (ADVIL,MOTRIN) 200 MG tablet, Take 600 mg by mouth every 6 (six) hours as needed for headache or moderate pain. , Disp: , Rfl:    Multiple Vitamins-Calcium (ONE-A-DAY WOMENS PO), Take 1 tablet by mouth daily. , Disp: , Rfl:      ROS:  Review of Systems BREAST: No symptoms    Objective: LMP  (LMP Unknown) Comment: pt negative for BRCA Gene for breast ca but positive ovarian ca. Complete hysterectomy done 04/2018   OBGyn Exam  Results: No results found for this or any previous visit (from the past 24 hour(s)).  Assessment/Plan:  No diagnosis found.   No orders of the defined types were placed in this encounter.           GYN counsel {counseling: 16159}    F/U  No follow-ups on file.  Hezikiah Retzloff B. Derrick Tiegs, PA-C 09/28/2021 12:18 PM

## 2021-10-01 ENCOUNTER — Ambulatory Visit (INDEPENDENT_AMBULATORY_CARE_PROVIDER_SITE_OTHER): Payer: Managed Care, Other (non HMO) | Admitting: Obstetrics and Gynecology

## 2021-10-01 ENCOUNTER — Encounter: Payer: Self-pay | Admitting: Obstetrics and Gynecology

## 2021-10-01 VITALS — BP 130/70 | Ht 64.0 in | Wt 185.0 lb

## 2021-10-01 DIAGNOSIS — Z01419 Encounter for gynecological examination (general) (routine) without abnormal findings: Secondary | ICD-10-CM | POA: Diagnosis not present

## 2021-10-01 DIAGNOSIS — Z1501 Genetic susceptibility to malignant neoplasm of breast: Secondary | ICD-10-CM

## 2021-10-01 DIAGNOSIS — R1031 Right lower quadrant pain: Secondary | ICD-10-CM

## 2021-10-01 DIAGNOSIS — R928 Other abnormal and inconclusive findings on diagnostic imaging of breast: Secondary | ICD-10-CM | POA: Diagnosis not present

## 2021-10-01 DIAGNOSIS — Z9189 Other specified personal risk factors, not elsewhere classified: Secondary | ICD-10-CM

## 2021-10-01 DIAGNOSIS — Z1589 Genetic susceptibility to other disease: Secondary | ICD-10-CM

## 2021-10-01 DIAGNOSIS — N941 Unspecified dyspareunia: Secondary | ICD-10-CM

## 2021-10-01 DIAGNOSIS — G8929 Other chronic pain: Secondary | ICD-10-CM

## 2021-10-01 DIAGNOSIS — Z1211 Encounter for screening for malignant neoplasm of colon: Secondary | ICD-10-CM

## 2021-10-01 DIAGNOSIS — Z1231 Encounter for screening mammogram for malignant neoplasm of breast: Secondary | ICD-10-CM

## 2021-10-01 NOTE — Patient Instructions (Addendum)
I value your feedback and you entrusting us with your care. If you get a [facility] patient survey, I would appreciate you taking the time to let us know about your experience today. Thank you!  [facility]: [phone]      

## 2021-10-03 ENCOUNTER — Encounter: Payer: Self-pay | Admitting: Obstetrics and Gynecology

## 2021-10-03 ENCOUNTER — Other Ambulatory Visit: Payer: Self-pay | Admitting: Obstetrics and Gynecology

## 2021-10-03 DIAGNOSIS — G8929 Other chronic pain: Secondary | ICD-10-CM

## 2021-10-03 MED ORDER — GABAPENTIN 300 MG PO CAPS: ORAL_CAPSULE | ORAL | 1 refills | Status: DC

## 2021-12-11 ENCOUNTER — Encounter: Payer: Self-pay | Admitting: Obstetrics and Gynecology

## 2021-12-11 ENCOUNTER — Ambulatory Visit
Admission: RE | Admit: 2021-12-11 | Discharge: 2021-12-11 | Disposition: A | Discharge status: Home / Self Care | Payer: Managed Care, Other (non HMO) | Source: Ambulatory Visit | Attending: Obstetrics and Gynecology | Admitting: Obstetrics and Gynecology

## 2021-12-11 DIAGNOSIS — Z1231 Encounter for screening mammogram for malignant neoplasm of breast: Secondary | ICD-10-CM | POA: Insufficient documentation

## 2021-12-11 DIAGNOSIS — R928 Other abnormal and inconclusive findings on diagnostic imaging of breast: Secondary | ICD-10-CM | POA: Insufficient documentation

## 2021-12-23 ENCOUNTER — Ambulatory Visit: Payer: Managed Care, Other (non HMO) | Admitting: Surgery

## 2021-12-30 ENCOUNTER — Other Ambulatory Visit: Payer: Self-pay

## 2021-12-30 ENCOUNTER — Ambulatory Visit (INDEPENDENT_AMBULATORY_CARE_PROVIDER_SITE_OTHER): Payer: Managed Care, Other (non HMO) | Admitting: Surgery

## 2021-12-30 ENCOUNTER — Encounter: Payer: Self-pay | Admitting: Surgery

## 2021-12-30 VITALS — BP 113/77 | HR 89 | Temp 98.4°F | Ht 63.0 in | Wt 181.0 lb

## 2021-12-30 DIAGNOSIS — R921 Mammographic calcification found on diagnostic imaging of breast: Secondary | ICD-10-CM

## 2021-12-30 NOTE — Progress Notes (Signed)
Outpatient Surgical Follow Up  12/30/2021  Michelle Mejia is an 61 y.o. female.   Chief Complaint  Patient presents with   Follow-up    Screen mammo    HPI: Michelle Mejia  is a 61 year old female status post excisional breast biopsy of the right side 02/2019 showing COMPLEX SCLEROSING LESION INCLUDING PAPILLOMA. She has no complaints today. No masses, no breast DC. She did have a mammogram that I have personally reviewed showing benign and unchanged calcification near prior lumpectomy. No masses.  No other health changes.   Past Medical History:  Diagnosis Date   Abnormal Pap smear of cervix    Arthritis    hips    BRCA negative 02/04/2012   Sample completed by [facility]. Performing laboratory: [facility]. 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   BREAST BIOPSY Right 11/10/2016   A. sclerosing adenosis, dicordant per radiologist x marker   BREAST BIOPSY Right 12/08/2018   MRI bx, dumbell marker, CSL   BREAST CYST ASPIRATION Left 1994   BREAST LUMPECTOMY Right 02/24/2019   CSL   BREAST LUMPECTOMY WITH NEEDLE LOCALIZATION Right 02/24/2019   Procedure: BREAST LUMPECTOMY WITH NEEDLE LOCALIZATION;  Surgeon: Jules Husbands, MD;  Location: [facility] ORS;   Service: General;  Laterality: Right;   COLONOSCOPY N/A 04/02/2015   Procedure: COLONOSCOPY;  Surgeon: Manya Silvas, MD;  Location: [facility] ENDOSCOPY;  Service: Endoscopy;  Laterality: N/A;   CYSTOSCOPY N/A 05/06/2018   Procedure: CYSTOSCOPY;  Surgeon: Gae Dry, MD;  Location: [facility] ORS;  Service: Gynecology;  Laterality: N/A;   TONSILLECTOMY     TOTAL ABDOMINAL HYSTERECTOMY W/ BILATERAL SALPINGOOPHORECTOMY  04/2018   Dr. Kenton Kingfisher; leio/FH ovar cancer/BRIP1 mutation    Family History  Problem Relation Age of Onset   Breast cancer Mother 33   Bone cancer Mother    Diabetes Mother    Heart disease Father    Thyroid disease Father    Colon polyps Sister    Cancer Sister        inside appendix   Breast cancer Maternal Aunt        post menopausal    Breast cancer Maternal Aunt        post menopausal    Ovarian cancer Maternal Aunt        post menopausal     Social History:  reports that she has never smoked. She has been exposed to tobacco smoke. She has never used smokeless tobacco. She reports that she does not drink alcohol and does not use drugs.  Allergies:  Allergies  Allergen Reactions   Doxycycline Nausea And Vomiting and Other (See Comments)    dizzy   Phenergan [Promethazine  Hcl] Nausea And Vomiting   Tramadol Nausea And Vomiting and Other (See Comments)    dizzy   Keflex [Cephalexin] Rash   Ketek [Telithromycin] Itching and Rash   Levaquin [Levofloxacin In D5w] Rash   Levofloxacin Rash   Nitrofurantoin Rash   Nitrofurantoin Monohyd Macro Rash   Other Rash    Muscle relaxer medication but not flexeril or robaxin pt unsure of the name.    Oxycodone Rash   Parafon Forte Dsc [Chlorzoxazone] Rash    Medications reviewed.    ROS Full ROS performed and is otherwise negative other than what is stated in HPI   BP 113/77   Pulse 89   Temp 98.4 F (36.9 C) (Oral)   Ht '5\' 3"'  (1.6 m)   Wt 181 lb (82.1 kg)   LMP  (LMP Unknown) Comment: pt negative for  BRCA Gene for breast ca but positive ovarian ca. Complete hysterectomy done 04/2018  SpO2 96%   BMI 32.06 kg/m   Physical Exam  Physical Exam Vitals and nursing note reviewed. Exam conducted with a chaperone present.  Constitutional:      General: She is not in acute distress.    Appearance: Normal appearance. She is normal weight.  Cardiovascular:     Rate and Rhythm: Normal rate and regular rhythm.  Pulmonary:     Effort: Pulmonary effort is normal. No respiratory distress.     Breath sounds: Normal breath sounds. No stridor.     BREASTS: Right lumpectomy scar with good breast contour, no deformity, palpable masses on either breast.  No evidence of lymphadenopathy Abdominal:     General: Abdomen is flat. There is no distension.     Palpations: Abdomen is soft. There is no mass.     Tenderness: There is no abdominal tenderness. There is no guarding or rebound.     Hernia: No hernia is present.  Musculoskeletal:     Cervical back: Normal range of motion and neck supple. No rigidity or tenderness.  Lymphadenopathy:     Cervical: No cervical adenopathy.  Skin:    General: Skin is warm and dry.     Capillary Refill: Capillary refill takes less than 2 seconds.  Neurological:     General: No focal deficit present.     Mental Status: She is alert and oriented to person, place, and time.  Psychiatric:        Mood and Affect: Mood normal.        Behavior: Behavior normal.        Thought Content: Thought content normal.        Judgment: Judgment normal.    Assessment/Plan:  Right breast calcification after lumpectomy for benign disease. Clinically no evidence of suspicious lesions.  We will continue yearly mammogram with physical exam.  Note that I spent  30 min in this encounter including coordination of care, placing orders, pers. Reviewing images and performing appropriate documentation   Caroleen Hamman, MD [facility] General Surgeon

## 2021-12-30 NOTE — Patient Instructions (Signed)
We will contact you in one year to schedule your mammogram and follow up with Dr.Pabon for the breast exam.   Breast Self-Awareness Breast self-awareness means being familiar with how your breasts look and feel. It involves checking your breasts regularly and telling your health care provider about any changes. Practicing breast self-awareness helps to maintain breast health. Sometimes, changes are not harmful (are benign). Other times, a change in your breasts can be a sign of a serious medical problem. Being familiar with the look and feel of your breasts can help you catch a breast problem while it is still small and can be treated. You should do breast self-exams even if you have breast implants. What you need: A mirror. A well-lit room. A pillow or other soft object. How to do a breast self-exam A breast self-exam is one way to learn what is normal for your breasts and whether your breasts are changing. To do a breast self-exam: Look for changes  Remove all the clothing above your waist. Stand in front of a mirror in a room with good lighting. Put your hands down at your sides. Compare your breasts in the mirror. Look for differences between them (asymmetry), such as: Differences in shape. Differences in size. Puckers, dips, and bumps in one breast and not the other. Look at each breast for changes in the skin, such as: Redness. Scaly areas. Skin thickening. Dimpling. Open sores (ulcers). Look for changes in your nipples, such as: Discharge. Bleeding. Dimpling. Redness. A nipple that looks pushed in (retracted), or that has changed position. Feel for changes Carefully feel your breasts for lumps and changes. It is best to do this self-exam while lying down. Follow these steps to feel each breast: Place a pillow under the shoulder of one side of your body. Place the arm of that side of your body behind your head. Feel the breast of that side of your body using the hand of the  opposite arm. To do this: Start in the nipple area and use the pads of your three middle fingers to make -inch (2 cm) overlapping circles. Use light, medium, and then firm pressure as you feel your breast, gently covering the entire breast area and armpit. Continue the overlapping circles, moving downward over the breast until you feel your ribs below your breast. Then, make circles with your fingers going upward until you reach your collarbone. Next, make circles by moving outward across your breast and into your armpit area. Squeeze the nipple. Check for discharge and lumps. Repeat steps 1-7 to check your other breast. Sit or stand in the tub or shower. With soapy water on your skin, feel each breast the same way you did when you were lying down. Write down what you find Writing down what you find can help you remember what to discuss with your health care provider. Write down: What is normal for each breast. Any changes that you find in each breast. These include: The kind of changes you find. Any pain or tenderness. Size and location of any lumps. Where you are in your menstrual cycle, if you are still getting your menstrual period (menstruating). General tips If you are breastfeeding, the best time to examine your breasts is after a feeding or after using a breast pump. If you menstruate, the best time to examine your breasts is 5-7 days after your menstrual period. Breasts are generally lumpier during menstrual periods, and it may be more difficult to notice changes. With time and  practice, you will become more familiar with the differences in your breasts and more comfortable with the exam. Contact a health care provider if: You see a change in the shape or size of your breasts or nipples. You see a change in the skin of your breast or nipples, such as a reddened or scaly area. You have unusual discharge from your nipples. You find a new lump or thick area. You have breast  pain. You have any concerns about your breast health. Summary Breast self-awareness includes looking for physical changes in your breasts and feeling for any changes within your breasts. Breast self-awareness should be done in front of a mirror in a well-lit room. If you menstruate, the best time to examine your breasts is 5-7 days after your menstrual period. Tell your health care provider about any changes you notice in your breasts. Changes include changes in size, changes on the skin, pain or tenderness, or unusual fluid from your nipples. This information is not intended to replace advice given to you by your health care provider. Make sure you discuss any questions you have with your health care provider. Document Revised: 02/19/2021 Document Reviewed: 01/31/2021 Elsevier Patient Education  [employer].

## 2022-05-26 DIAGNOSIS — K640 First degree hemorrhoids: Secondary | ICD-10-CM | POA: Diagnosis not present

## 2022-05-26 DIAGNOSIS — Z86010 Personal history of colonic polyps: Secondary | ICD-10-CM | POA: Diagnosis present

## 2022-05-26 DIAGNOSIS — K5730 Diverticulosis of large intestine without perforation or abscess without bleeding: Secondary | ICD-10-CM | POA: Diagnosis not present

## 2022-05-26 DIAGNOSIS — D120 Benign neoplasm of cecum: Secondary | ICD-10-CM | POA: Diagnosis not present

## 2022-05-26 DIAGNOSIS — D125 Benign neoplasm of sigmoid colon: Secondary | ICD-10-CM | POA: Diagnosis not present

## 2022-09-29 ENCOUNTER — Encounter: Payer: Self-pay | Admitting: Obstetrics and Gynecology

## 2022-09-29 NOTE — Telephone Encounter (Signed)
Any premarin vag crm samples? If so, pls leave one for pt at [facility]. If not, I'll do Rx. Thx.

## 2022-11-17 DIAGNOSIS — N390 Urinary tract infection, site not specified: Secondary | ICD-10-CM | POA: Insufficient documentation

## 2022-11-17 DIAGNOSIS — M62838 Other muscle spasm: Secondary | ICD-10-CM | POA: Insufficient documentation

## 2022-11-17 DIAGNOSIS — N398 Other specified disorders of urinary system: Secondary | ICD-10-CM | POA: Insufficient documentation

## 2022-11-17 DIAGNOSIS — N941 Unspecified dyspareunia: Secondary | ICD-10-CM | POA: Insufficient documentation

## 2022-11-18 ENCOUNTER — Other Ambulatory Visit: Payer: Self-pay | Admitting: Obstetrics and Gynecology

## 2022-11-18 DIAGNOSIS — R921 Mammographic calcification found on diagnostic imaging of breast: Secondary | ICD-10-CM

## 2022-12-18 ENCOUNTER — Ambulatory Visit
Admission: RE | Admit: 2022-12-18 | Discharge: 2022-12-18 | Disposition: A | Discharge status: Home / Self Care | Payer: Managed Care, Other (non HMO) | Source: Ambulatory Visit | Attending: Obstetrics and Gynecology | Admitting: Obstetrics and Gynecology

## 2022-12-18 DIAGNOSIS — R921 Mammographic calcification found on diagnostic imaging of breast: Secondary | ICD-10-CM | POA: Diagnosis present

## 2022-12-29 ENCOUNTER — Ambulatory Visit: Payer: Managed Care, Other (non HMO) | Admitting: Surgery

## 2022-12-29 ENCOUNTER — Encounter: Payer: Self-pay | Admitting: Surgery

## 2022-12-29 VITALS — BP 109/73 | HR 70 | Temp 97.8°F | Ht 63.0 in | Wt 147.4 lb

## 2022-12-29 DIAGNOSIS — R921 Mammographic calcification found on diagnostic imaging of breast: Secondary | ICD-10-CM

## 2022-12-29 NOTE — Patient Instructions (Addendum)
If you have any concerns or questions, please feel free to call our office. Follow up in 1 year.   Breast Self-Awareness Breast self-awareness is knowing how your breasts look and feel. You need to: Check your breasts on a regular basis. Tell your doctor about any changes. Become familiar with the look and feel of your breasts. This can help you catch a breast problem while it is still small and can be treated. You should do breast self-exams even if you have breast implants. What you need: A mirror. A well-lit room. A pillow or other soft object. How to do a breast self-exam Follow these steps to do a breast self-exam: Look for changes  Take off all the clothes above your waist. Stand in front of a mirror in a room with good lighting. Put your hands down at your sides. Compare your breasts in the mirror. Look for any difference between them, such as: A difference in shape. A difference in size. Wrinkles, dips, and bumps in one breast and not the other. Look at each breast for changes in the skin, such as: Redness. Scaly areas. Skin that has gotten thicker. Dimpling. Open sores (ulcers). Look for changes in your nipples, such as: Fluid coming out of a nipple. Fluid around a nipple. Bleeding. Dimpling. Redness. A nipple that looks pushed in (retracted), or that has changed position. Feel for changes Lie on your back. Feel each breast. To do this: Pick a breast to feel. Place a pillow under the shoulder closest to that breast. Put the arm closest to that breast behind your head. Feel the nipple area of that breast using the hand of your other arm. Feel the area with the pads of your three middle fingers by making small circles with your fingers. Use light, medium, and firm pressure. Continue the overlapping circles, moving downward over the breast. Keep making circles with your fingers. Stop when you feel your ribs. Start making circles with your fingers again, this time going  upward until you reach your collarbone. Then, make circles outward across your breast and into your armpit area. Squeeze your nipple. Check for discharge and lumps. Repeat these steps to check your other breast. Sit or stand in the tub or shower. With soapy water on your skin, feel each breast the same way you did when you were lying down. Write down what you find Writing down what you find can help you remember what to tell your doctor. Write down: What is normal for each breast. Any changes you find in each breast. These include: The kind of changes you find. A tender or painful breast. Any lump you find. Write down its size and where it is. When you last had your monthly period (menstrual cycle). General tips If you are breastfeeding, the best time to check your breasts is after you feed your baby or after you use a breast pump. If you get monthly bleeding, the best time to check your breasts is 5-7 days after your monthly cycle ends. With time, you will become comfortable with the self-exam. You will also start to know if there are changes in your breasts. Contact a doctor if: You see a change in the shape or size of your breasts or nipples. You see a change in the skin of your breast or nipples, such as red or scaly skin. You have fluid coming from your nipples that is not normal. You find a new lump or thick area. You have breast pain. You  have any concerns about your breast health. Summary Breast self-awareness includes looking for changes in your breasts and feeling for changes within your breasts. You should do breast self-awareness in front of a mirror in a well-lit room. If you get monthly periods (menstrual cycles), the best time to check your breasts is 5-7 days after your period ends. Tell your doctor about any changes you see in your breasts. Changes include changes in size, changes on the skin, painful or tender breasts, or fluid from your nipples that is not  normal. This information is not intended to replace advice given to you by your health care provider. Make sure you discuss any questions you have with your health care provider. Document Revised: 09/05/2021 Document Reviewed: 01/31/2021 Elsevier Patient Education  2024 [employer].

## 2023-01-02 ENCOUNTER — Encounter: Payer: Self-pay | Admitting: Surgery

## 2023-01-02 NOTE — Progress Notes (Signed)
Outpatient Surgical Follow Up    Michelle Mejia is an 62 y.o. female.   Chief Complaint  Patient presents with   Follow-up    Mammo 12/18/22    HPI:  Michelle Mejia  is a 62 year old female prior hx of excisional breast biopsy of the right  02/2019 showing COMPLEX SCLEROSING LESION INCLUDING PAPILLOMA. She did have a mammogram that I have personally reviewed decreased calcifications near prior lumpectomy. No masses.  She has lost significant intentional weight and feels better She has no complaints today. No masses, no breast DC. No other health changes.  Past Medical History:  Diagnosis Date   Abnormal Pap smear of cervix    Arthritis    hips    BRCA negative 02/04/2012   Sample completed by [facility]. Performing laboratory: [facility]. 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   BREAST BIOPSY Right 11/10/2016   A. sclerosing adenosis, dicordant per radiologist x marker   BREAST BIOPSY Right 12/08/2018   MRI bx, dumbell marker, CSL   BREAST CYST ASPIRATION Left 1994   BREAST LUMPECTOMY Right 02/24/2019   CSL   BREAST LUMPECTOMY WITH NEEDLE LOCALIZATION Right 02/24/2019   Procedure: BREAST LUMPECTOMY WITH NEEDLE LOCALIZATION;  Surgeon: Michelle Ro,  MD;  Location: [facility] ORS;  Service: General;  Laterality: Right;   COLONOSCOPY N/A 04/02/2015   Procedure: COLONOSCOPY;  Surgeon: Michelle Jun, MD;  Location: [facility] ENDOSCOPY;  Service: Endoscopy;  Laterality: N/A;   CYSTOSCOPY N/A 05/06/2018   Procedure: CYSTOSCOPY;  Surgeon: Michelle Mustard, MD;  Location: [facility] ORS;  Service: Gynecology;  Laterality: N/A;   TONSILLECTOMY     TOTAL ABDOMINAL HYSTERECTOMY W/ BILATERAL SALPINGOOPHORECTOMY  04/2018   Dr. Tiburcio Mejia; leio/FH ovar cancer/BRIP1 mutation    Family History  Problem Relation Age of Onset   Breast cancer Mother 7   Bone cancer Mother    Diabetes Mother    Heart disease Father    Thyroid disease Father    Colon polyps Sister    Cancer Sister        inside appendix   Breast cancer Maternal Aunt        post menopausal    Breast cancer Maternal Aunt        post menopausal    Ovarian cancer Maternal Aunt        post menopausal     Social History:  reports that she has never smoked. She has been exposed to tobacco smoke. She has never used smokeless tobacco. She reports that she does not drink alcohol and does not use drugs.  Allergies:  Allergies  Allergen Reactions   Doxycycline Nausea And Vomiting and Other (See Comments)  dizzy   Phenergan [Promethazine Hcl] Nausea And Vomiting   Tramadol Nausea And Vomiting and Other (See Comments)    dizzy   Keflex [Cephalexin] Rash   Ketek [Telithromycin] Itching and Rash   Levaquin [Levofloxacin In D5w] Rash   Levofloxacin Rash   Nitrofurantoin Rash   Nitrofurantoin Monohyd Macro Rash   Other Rash    Muscle relaxer medication but not flexeril or robaxin pt unsure of the name.    Oxycodone Rash   Parafon Forte Dsc [Chlorzoxazone] Rash    Medications reviewed.    ROS Full ROS performed and is otherwise negative other than what is stated in HPI   BP 109/73 (BP Location: Right Arm, Patient Position: Sitting, Cuff Size: Normal)   Pulse 70   Temp 97.8 F (36.6 C)  (Oral)   Ht 5\' 3"  (1.6 m)   Wt 147 lb 6.4 oz (66.9 kg)   LMP  (LMP Unknown) Comment: pt negative for BRCA Gene for breast ca but positive ovarian ca. Complete hysterectomy done 04/2018  SpO2 98%   BMI 26.11 kg/m   Physical Exam   Physical Exam Vitals and nursing note reviewed. Exam conducted with a chaperone present.  Constitutional:      General: She is not in acute distress.    Appearance: Normal appearance. She is normal weight.  Cardiovascular:     Rate and Rhythm: Normal rate and regular rhythm.  Pulmonary:     Effort: Pulmonary effort is normal. No respiratory distress.     Breath sounds: Normal breath sounds. No stridor.     BREASTS: Right lumpectomy scar with good breast contour, no deformity, palpable masses on either breast.  No evidence of lymphadenopathy Abdominal:     General: Abdomen is flat. There is no distension.     Palpations: Abdomen is soft. There is no mass.     Tenderness: There is no abdominal tenderness. There is no guarding or rebound.     Hernia: No hernia is present.  Musculoskeletal:     Cervical back: Normal range of motion and neck supple. No rigidity or tenderness.  Lymphadenopathy:     Cervical: No cervical adenopathy.  Skin:    General: Skin is warm and dry.     Capillary Refill: Capillary refill takes less than 2 seconds.  Neurological:     General: No focal deficit present.     Mental Status: She is alert and oriented to person, place, and time.  Psychiatric:        Mood and Affect: Mood normal.        Behavior: Behavior normal.        Thought Content: Thought content normal.        Judgment: Judgment normal.      Assessment/Plan:  Right breast calcification after lumpectomy for benign disease actually improved. Clinically no evidence of suspicious lesions.  We will continue yearly mammogram with physical exam. Please  Note that I spent  30 min in this encounter including coordination of care, placing orders, pers. Reviewing images and  performing appropriate documentation   Michelle Big, MD [facility] General Surgeon

## 2023-01-19 DIAGNOSIS — R7303 Prediabetes: Secondary | ICD-10-CM | POA: Insufficient documentation

## 2023-09-24 ENCOUNTER — Ambulatory Visit: Admitting: Urology

## 2023-09-24 VITALS — BP 132/79 | HR 92 | Ht 63.0 in | Wt 155.0 lb

## 2023-09-24 DIAGNOSIS — R310 Gross hematuria: Secondary | ICD-10-CM | POA: Diagnosis not present

## 2023-09-24 DIAGNOSIS — R300 Dysuria: Secondary | ICD-10-CM | POA: Diagnosis not present

## 2023-09-24 DIAGNOSIS — N958 Other specified menopausal and perimenopausal disorders: Secondary | ICD-10-CM

## 2023-09-24 LAB — MICROSCOPIC EXAMINATION

## 2023-09-24 LAB — URINALYSIS, COMPLETE
Bilirubin, UA: NEGATIVE
Glucose, UA: NEGATIVE
Ketones, UA: NEGATIVE
Leukocytes,UA: NEGATIVE
Nitrite, UA: NEGATIVE
Protein,UA: NEGATIVE
Specific Gravity, UA: 1.025 (ref 1.005–1.030)
Urobilinogen, Ur: 0.2 mg/dL (ref 0.2–1.0)
pH, UA: 6 (ref 5.0–7.5)

## 2023-09-24 MED ORDER — ESTRADIOL 0.1 MG/GM VA CREA: TOPICAL_CREAM | VAGINAL | 12 refills | Status: AC

## 2023-09-24 NOTE — Progress Notes (Signed)
 Michelle Mejia,acting as a scribe for Dustin Gimenez, MD.,have documented all relevant documentation on the behalf of Dustin Gimenez, MD,as directed by  Dustin Gimenez, MD while in the presence of Dustin Gimenez, MD.  09/24/23 2:36 PM   Michelle Mejia 1960/12/18 [id number]  Referring provider: Melchor Spoon, MD [address] [facility] [place],  [place] [address]  Chief Complaint  Patient presents with   Dysuria    HPI:  63 year old female referred for further evaluation of chronic pelvic discomfort following a hysterectomy and presumed UTIs. She self-treats with Azo and Cystex, and her urinary symptoms worsen with dehydration. She is on Estrace Cream vaginally twice a week.   Her last urinalysis by her primary care on 5/1/ 2025 was completely negative other than for the presence of calcium oxalate stones and a urine culture grew a low colony count of Enterococcus and E.coli, which was highly suspicious for contamination. She had a positive urinalysis in April without a culture on that occasion, resulting in essentially one positive urinalysis and one positive urine culture over the past calendar year.   Her last imaging in 2020 showed no kidney stones.   Today, she has microscopic hematuria with 3-to-10 red blood cells per high power field in the absence of infection and many calcium oxalate crystals.   She reports a history of frequent UTIs in her younger years, which leveled out until menopause, when she began experiencing frequent UTIs after sex.   Following a hysterectomy in 2020, she developed pelvic floor issues, including constant pain and painful intercourse, which she attributes to possible nerve damage.   In May of last year, she started a diet and lost about 45 pounds, after which her UTI symptoms resurfaced, accompanied by severe back pain, muscle spasms, and foul-smelling urine in the morning. She reports that her urine sometimes shows spots of  blood, crystals, or low bacteria.   She has a family history of cancer and believes she may have the BRCA ovarian cancer gene, which has influenced her use of hormone treatments. She has been prescribed topical estrogen cream but has not used it consistently. She reports that her urinary symptoms sometimes improve with Azo.   She has a history of a kidney stone many years ago and denies recent stones or smoking. She is concerned about her risk of cancer due to her family history.  PMH: Past Medical History:  Diagnosis Date   Abnormal Pap smear of cervix    Arthritis    hips    BRCA negative 02/04/2012   Sample completed by [facility]. Performing laboratory: [facility]. 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  BREAST BIOPSY Right 11/10/2016   A. sclerosing adenosis, dicordant per radiologist x marker   BREAST BIOPSY Right 12/08/2018   MRI bx, dumbell marker, CSL   BREAST CYST ASPIRATION Left 1994   BREAST LUMPECTOMY Right 02/24/2019   CSL   BREAST LUMPECTOMY WITH NEEDLE LOCALIZATION Right 02/24/2019   Procedure: BREAST LUMPECTOMY WITH NEEDLE LOCALIZATION;  Surgeon: Alben Alma, MD;  Location: [facility] ORS;  Service: General;   Laterality: Right;   COLONOSCOPY N/A 04/02/2015   Procedure: COLONOSCOPY;  Surgeon: Cassie Click, MD;  Location: [facility] ENDOSCOPY;  Service: Endoscopy;  Laterality: N/A;   CYSTOSCOPY N/A 05/06/2018   Procedure: CYSTOSCOPY;  Surgeon: Alben Alma, MD;  Location: [facility] ORS;  Service: Gynecology;  Laterality: N/A;   TONSILLECTOMY     TOTAL ABDOMINAL HYSTERECTOMY W/ BILATERAL SALPINGOOPHORECTOMY  04/2018   Dr. Raquel Cables; leio/FH ovar cancer/BRIP1 mutation    Home Medications:  Allergies as of 09/24/2023       Reactions   Doxycycline Nausea And Vomiting, Other (See Comments)   dizzy   Phenergan [promethazine Hcl] Nausea And Vomiting   Tramadol Nausea And Vomiting, Other (See Comments)   dizzy   Keflex [cephalexin] Rash   Ketek [telithromycin] Itching, Rash   Levaquin [levofloxacin In D5w] Rash   Levofloxacin Rash   Nitrofurantoin Rash   Nitrofurantoin Monohyd Macro Rash   Other Rash   Muscle relaxer medication but not flexeril or robaxin pt unsure of the name.    Oxycodone  Rash   Parafon Forte Dsc [chlorzoxazone] Rash        Medication List        Accurate as of September 24, 2023  2:36 PM. If you have any questions, ask your nurse or doctor.          calcium carbonate 500 MG chewable tablet Commonly known as: TUMS - dosed in mg elemental calcium Chew 1 tablet by mouth daily as needed for indigestion or heartburn.   estradiol 0.1 MG/GM vaginal cream Commonly known as: ESTRACE VAGINAL Apply a pea small amount three times weekly Start taking on: September 25, 2023   ibuprofen 200 MG tablet Commonly known as: ADVIL Take 600 mg by mouth every 6 (six) hours as needed for headache or moderate pain.   meloxicam 15 MG tablet Commonly known as: MOBIC Take 15 mg by mouth daily.   ONE-A-DAY WOMENS PO Take 1 tablet by mouth daily.        Allergies:  Allergies  Allergen Reactions   Doxycycline Nausea And Vomiting and Other (See Comments)    dizzy   Phenergan [Promethazine  Hcl] Nausea And Vomiting   Tramadol Nausea And Vomiting and Other (See Comments)    dizzy   Keflex [Cephalexin] Rash   Ketek [Telithromycin] Itching and Rash   Levaquin [Levofloxacin In D5w] Rash   Levofloxacin Rash   Nitrofurantoin Rash   Nitrofurantoin Monohyd Macro Rash   Other Rash    Muscle relaxer medication but not flexeril or robaxin pt unsure of the name.    Oxycodone  Rash   Parafon Forte Dsc [Chlorzoxazone] Rash    Family History: Family History  Problem Relation Age of Onset   Breast cancer Mother 73   Bone cancer Mother    Diabetes Mother    Heart disease Father    Thyroid disease Father    Colon polyps Sister    Cancer Sister        inside appendix   Breast cancer Maternal Aunt  post menopausal    Breast cancer Maternal Aunt        post menopausal    Ovarian cancer Maternal Aunt        post menopausal     Social History:  reports that she has never smoked. She has been exposed to tobacco smoke. She has never used smokeless tobacco. She reports that she does not drink alcohol and does not use drugs.   Physical Exam: BP 132/79   Pulse 92   Ht 5' 3 (1.6 m)   Wt 155 lb (70.3 kg)   LMP  (LMP Unknown) Comment: pt negative for BRCA Gene for breast ca but positive ovarian ca. Complete hysterectomy done 04/2018  BMI 27.46 kg/m   Constitutional:  Alert and oriented, No acute distress. HEENT: [place] AT, moist mucus membranes.  Trachea midline, no masses. Neurologic: Grossly intact, no focal deficits, moving all 4 extremities. Psychiatric: Normal mood and affect.   Assessment & Plan:    1. Genitourinary Syndrome of Menopause  - She is experiencing symptoms consistent with GSM, including urinary frequency, urgency, and discomfort, likely due to atrophic changes in the genitourinary tract.  - Recommend the use of topical Estrace applied in a pea-sized amount to the urethral area three times a week to alleviate symptoms and reduce the frequency of UTIs. She is  advised to use the cream consistently as directed.  2. Recurrent Urinary Tract Infections - She has a history of recurrent UTIs, with recent urinalysis showing microscopic hematuria and calcium oxalate crystals but no evidence of infection.  -Suggest the use of cranberry tablets, probiotics, and D-mannose as adjunctive measures to reduce UTI recurrence. She is advised to avoid unnecessary antibiotic use due to her allergies and to reserve antibiotics for confirmed infections.  3. Microscopic Hematuria - The presence of microscopic hematuria without infection warrants further investigation. - Renal ultrasound to evaluate for kidney stones or lesions and a cystoscopy to assess the bladder for any abnormalities. She is informed about the procedures and their low risk.  4. Pelvic Discomfort Post-Hysterectomy - She reports chronic pelvic discomfort and dyspareunia, possibly related to nerve damage post-hysterectomy.  - Follow-up with a specialist for further evaluation and management of pelvic floor issues.  Return in about 1 month (around 10/24/2023) for cystoscopy with Dr. Estanislao Heimlich and review of renal ultrasound results.  I have reviewed the above documentation for accuracy and completeness, and I agree with the above.   Dustin Gimenez, MD    [facility] [address] [place], [place] [address] [phone]

## 2023-09-24 NOTE — Patient Instructions (Addendum)
 Schedule [id number]    Cranberry and d-mannose

## 2023-10-06 ENCOUNTER — Ambulatory Visit
Admission: RE | Admit: 2023-10-06 | Discharge: 2023-10-06 | Disposition: A | Discharge status: Home / Self Care | Source: Ambulatory Visit | Attending: Urology | Admitting: Urology

## 2023-10-06 DIAGNOSIS — R310 Gross hematuria: Secondary | ICD-10-CM | POA: Insufficient documentation

## 2023-10-06 DIAGNOSIS — N958 Other specified menopausal and perimenopausal disorders: Secondary | ICD-10-CM | POA: Insufficient documentation

## 2023-10-06 DIAGNOSIS — R300 Dysuria: Secondary | ICD-10-CM | POA: Diagnosis present

## 2023-10-29 ENCOUNTER — Other Ambulatory Visit: Payer: Self-pay

## 2023-10-29 DIAGNOSIS — Z1231 Encounter for screening mammogram for malignant neoplasm of breast: Secondary | ICD-10-CM

## 2023-11-12 ENCOUNTER — Encounter: Payer: Self-pay | Admitting: Urology

## 2023-11-12 ENCOUNTER — Ambulatory Visit: Admitting: Urology

## 2023-11-12 VITALS — BP 119/77 | Ht 63.0 in | Wt 155.0 lb

## 2023-11-12 DIAGNOSIS — R3129 Other microscopic hematuria: Secondary | ICD-10-CM

## 2023-11-12 DIAGNOSIS — E785 Hyperlipidemia, unspecified: Secondary | ICD-10-CM | POA: Insufficient documentation

## 2023-11-12 NOTE — Patient Instructions (Signed)

## 2023-11-12 NOTE — Progress Notes (Signed)
 11/12/2023 9:13 AM   Michelle Mejia [birth year] [id number]  Referring provider: [name], PA-C [address] [place],  [place] [address]  Chief Complaint  Patient presents with   Medical Management of Chronic Issues    HPI: Michelle Mejia is a 63 y.o. female who presents for follow-up.  Refer to Dr. Bjorn 09/24/2023 She was scheduled for a renal ultrasound follow-up but not cystoscopy RUS performed 10/06/2023 showed no abnormalities   PMH: Past Medical History:  Diagnosis Date   Abnormal Pap smear of cervix    Arthritis    hips    BRCA negative 02/04/2012   Sample completed by [facility]. Performing laboratory: [facility]. 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   BREAST BIOPSY Right 11/10/2016   A. sclerosing adenosis, dicordant per radiologist x marker   BREAST BIOPSY Right 12/08/2018   MRI bx, dumbell marker, CSL   BREAST CYST ASPIRATION Left 1994   BREAST LUMPECTOMY Right 02/24/2019   CSL   BREAST LUMPECTOMY WITH NEEDLE LOCALIZATION Right 02/24/2019   Procedure: BREAST LUMPECTOMY WITH NEEDLE LOCALIZATION;  Surgeon: [name], MD;  Location: [facility] ORS;  Service: General;  Laterality:  Right;   COLONOSCOPY N/A 04/02/2015   Procedure: COLONOSCOPY;  Surgeon: [name], MD;  Location: [facility] ENDOSCOPY;  Service: Endoscopy;  Laterality: N/A;   CYSTOSCOPY N/A 05/06/2018   Procedure: CYSTOSCOPY;  Surgeon: [name], MD;  Location: [facility] ORS;  Service: Gynecology;  Laterality: N/A;   TONSILLECTOMY     TOTAL ABDOMINAL HYSTERECTOMY W/ BILATERAL SALPINGOOPHORECTOMY  04/2018   Dr. Arloa; leio/FH ovar cancer/BRIP1 mutation    Home Medications:  Allergies as of 11/12/2023       Reactions   Doxycycline Nausea And Vomiting, Other (See Comments)   dizzy   Phenergan [promethazine Hcl] Nausea And Vomiting   Tramadol Nausea And Vomiting, Other (See Comments)   dizzy   Keflex [cephalexin] Rash   Ketek [telithromycin] Itching, Rash   Levaquin [levofloxacin In D5w] Rash   Levofloxacin Rash   Nitrofurantoin Rash   Nitrofurantoin Monohyd Macro Rash   Other Rash   Muscle relaxer medication but not flexeril or robaxin pt unsure of the name.    Oxycodone  Rash   Parafon Forte Dsc [chlorzoxazone] Rash        Medication List        Accurate as of November 12, 2023  9:13 AM. If you have any questions, ask your nurse or doctor.          calcium carbonate 500 MG chewable tablet Commonly known as: TUMS - dosed in mg elemental calcium Chew 1 tablet by mouth daily  as needed for indigestion or heartburn.   estradiol  0.1 MG/GM vaginal cream Commonly known as: ESTRACE  VAGINAL Apply a pea small amount three times weekly   ibuprofen 200 MG tablet Commonly known as: ADVIL Take 600 mg by mouth every 6 (six) hours as needed for headache or moderate pain.   meloxicam 15 MG tablet Commonly known as: MOBIC Take 15 mg by mouth daily.   ONE-A-DAY WOMENS PO Take 1 tablet by mouth daily.        Allergies:  Allergies  Allergen Reactions   Doxycycline Nausea And Vomiting and Other (See Comments)    dizzy   Phenergan [Promethazine Hcl] Nausea And Vomiting   Tramadol Nausea  And Vomiting and Other (See Comments)    dizzy   Keflex [Cephalexin] Rash   Ketek [Telithromycin] Itching and Rash   Levaquin [Levofloxacin In D5w] Rash   Levofloxacin Rash   Nitrofurantoin Rash   Nitrofurantoin Monohyd Macro Rash   Other Rash    Muscle relaxer medication but not flexeril or robaxin pt unsure of the name.    Oxycodone  Rash   Parafon Forte Dsc [Chlorzoxazone] Rash    Family History: Family History  Problem Relation Age of Onset   Breast cancer Mother 72   Bone cancer Mother    Diabetes Mother    Heart disease Father    Thyroid disease Father    Colon polyps Sister    Cancer Sister        inside appendix   Breast cancer Maternal Aunt        post menopausal    Breast cancer Maternal Aunt        post menopausal    Ovarian cancer Maternal Aunt        post menopausal     Social History:  reports that she has never smoked. She has been exposed to tobacco smoke. She has never used smokeless tobacco. She reports that she does not drink alcohol and does not use drugs.   Physical Exam: BP 119/77   Ht 5' 3 (1.6 m)   Wt 155 lb (70.3 kg)   LMP  (LMP Unknown) Comment: pt negative for BRCA Gene for breast ca but positive ovarian ca. Complete hysterectomy done 04/2018  BMI 27.46 kg/m   Constitutional:  Alert and oriented, No acute distress. Psychiatric: Normal mood and affect.   Assessment & Plan:   Negative renal ultrasound Prior UA showed 3-10 RBCs and will schedule cystoscopy   Michelle JAYSON Barba, MD  [facility] [address] [place], [place] [address] [phone]

## 2023-12-01 ENCOUNTER — Ambulatory Visit: Admitting: Urology

## 2023-12-01 ENCOUNTER — Encounter: Payer: Self-pay | Admitting: Urology

## 2023-12-01 VITALS — BP 109/73 | HR 72 | Ht 64.0 in | Wt 155.0 lb

## 2023-12-01 DIAGNOSIS — R3129 Other microscopic hematuria: Secondary | ICD-10-CM

## 2023-12-01 DIAGNOSIS — N3592 Unspecified urethral stricture, female: Secondary | ICD-10-CM

## 2023-12-01 DIAGNOSIS — R310 Gross hematuria: Secondary | ICD-10-CM

## 2023-12-01 DIAGNOSIS — R300 Dysuria: Secondary | ICD-10-CM

## 2023-12-01 LAB — URINALYSIS, COMPLETE
Bilirubin, UA: NEGATIVE
Glucose, UA: NEGATIVE
Ketones, UA: NEGATIVE
Leukocytes,UA: NEGATIVE
Nitrite, UA: NEGATIVE
Protein,UA: NEGATIVE
RBC, UA: NEGATIVE
Specific Gravity, UA: 1.03 (ref 1.005–1.030)
Urobilinogen, Ur: 0.2 mg/dL (ref 0.2–1.0)
pH, UA: 6 (ref 5.0–7.5)

## 2023-12-01 LAB — MICROSCOPIC EXAMINATION: Epithelial Cells (non renal): >10 /HPF — AB (ref 0–10)

## 2023-12-01 MED ORDER — TAMSULOSIN HCL 0.4 MG PO CAPS: 0.4000 mg | ORAL_CAPSULE | Freq: Every day | ORAL | 1 refills | Status: DC

## 2023-12-01 NOTE — Progress Notes (Signed)
 12/01/2023 5:15 PM   Michelle Mejia [birth year] [id number]  Referring provider: [name], PA-C [address] [place],  [place] [address]  Chief Complaint  Patient presents with   Cysto    HPI: Michelle Mejia is a 63 y.o. female scheduled today for follow-up of microhematuria.    Refer to my previous note 11/12/2023.  States her voiding symptoms have improved Was scheduled for cystoscopy today however the procedure was unable to be performed secondary to inability to pass the cystoscope per urethra.  PMH: Past Medical History:  Diagnosis Date   Abnormal Pap smear of cervix    Arthritis    hips    BRCA negative 02/04/2012   Sample completed by [facility]. Performing laboratory: [facility]. 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   BREAST BIOPSY Right 11/10/2016   A. sclerosing adenosis, dicordant per radiologist x marker   BREAST BIOPSY Right 12/08/2018   MRI bx, dumbell marker, CSL   BREAST CYST ASPIRATION Left 1994   BREAST LUMPECTOMY Right 02/24/2019   CSL   BREAST LUMPECTOMY WITH NEEDLE LOCALIZATION Right 02/24/2019   Procedure: BREAST LUMPECTOMY WITH NEEDLE LOCALIZATION;  Surgeon: Michelle Laneta FALCON,  MD;  Location: [facility] ORS;  Service: General;  Laterality: Right;   COLONOSCOPY N/A 04/02/2015   Procedure: COLONOSCOPY;  Surgeon: Michelle ONEIDA Holmes, MD;  Location: [facility] ENDOSCOPY;  Service: Endoscopy;  Laterality: N/A;   CYSTOSCOPY N/A 05/06/2018   Procedure: CYSTOSCOPY;  Surgeon: Michelle Michelle SQUIBB, MD;  Location: [facility] ORS;  Service: Gynecology;  Laterality: N/A;   TONSILLECTOMY     TOTAL ABDOMINAL HYSTERECTOMY W/ BILATERAL SALPINGOOPHORECTOMY  04/2018   Dr. Arloa; leio/FH ovar cancer/BRIP1 mutation    Home Medications:  Allergies as of 12/01/2023       Reactions   Doxycycline Nausea And Vomiting, Other (See Comments)   dizzy   Phenergan [promethazine Hcl] Nausea And Vomiting   Tramadol Nausea And Vomiting, Other (See Comments)   dizzy   Keflex [cephalexin] Rash   Ketek [telithromycin] Itching, Rash   Levaquin [levofloxacin In D5w] Rash   Levofloxacin Rash   Nitrofurantoin Rash   Nitrofurantoin Monohyd Macro Rash   Other Rash   Muscle relaxer medication but not flexeril or robaxin pt unsure of the name.    Oxycodone  Rash   Parafon Forte Dsc [chlorzoxazone] Rash        Medication List        Accurate as of December 01, 2023  5:15 PM. If you have any questions, ask your nurse or doctor.          calcium carbonate 500 MG chewable tablet Commonly known as: TUMS -  dosed in mg elemental calcium Chew 1 tablet by mouth daily as needed for indigestion or heartburn.   estradiol  0.1 MG/GM vaginal cream Commonly known as: ESTRACE  VAGINAL Apply a pea small amount three times weekly   ibuprofen 200 MG tablet Commonly known as: ADVIL Take 600 mg by mouth every 6 (six) hours as needed for headache or moderate pain.   meloxicam 15 MG tablet Commonly known as: MOBIC Take 15 mg by mouth daily.   ONE-A-DAY WOMENS PO Take 1 tablet by mouth daily.   tamsulosin  0.4 MG Caps capsule Commonly known as: FLOMAX  Take 1 capsule (0.4 mg total) by mouth daily. Started by: Michelle Mejia        Allergies:  Allergies  Allergen Reactions   Doxycycline Nausea And Vomiting and Other (See Comments)    dizzy   Phenergan [Promethazine Hcl] Nausea And Vomiting   Tramadol Nausea And Vomiting and Other (See Comments)    dizzy   Keflex [Cephalexin] Rash   Ketek [Telithromycin] Itching and Rash   Levaquin [Levofloxacin In D5w] Rash   Levofloxacin Rash   Nitrofurantoin Rash   Nitrofurantoin Monohyd Macro Rash   Other Rash    Muscle relaxer medication but not flexeril or robaxin pt unsure of the name.    Oxycodone  Rash   Parafon Forte Dsc [Chlorzoxazone] Rash    Family History: Family History  Problem Relation Age of Onset   Breast cancer Mother 86   Bone cancer Mother    Diabetes Mother    Heart disease Father    Thyroid disease Father    Colon polyps Sister    Cancer Sister        inside appendix   Breast cancer Maternal Aunt        post menopausal    Breast cancer Maternal Aunt        post menopausal    Ovarian cancer Maternal Aunt        post menopausal     Social History:  reports that she has never smoked. She has been exposed to tobacco smoke. She has never used smokeless tobacco. She reports that she does not drink alcohol and does not use drugs.   Physical Exam: BP 109/73   Pulse 72   Ht 5' 4 (1.626 m)   Wt 155 lb (70.3 kg)   LMP  (LMP Unknown) Comment: pt negative for BRCA Gene for breast ca but positive ovarian ca. Complete hysterectomy done 04/2018  SpO2 96%   BMI 26.61 kg/m   Constitutional:  Alert, No acute distress. HEENT:  AT Respiratory: Normal respiratory effort, no increased work of breathing. GU: Urethral meatus was normal in appearance however cystoscope was unable to be placed per urethra Psychiatric: Normal mood and affect.  Laboratory Data:  Urinalysis Dipstick/microscopy negative   Assessment & Plan:    1. Microhematuria Unable to place cystoscope per urethra.  Discussed options of urethral dilation today  followed by cystoscopy and performing the procedure under sedation.  She has elected to perform the procedure under sedation and will schedule in same-day surgery Trial of tamsulosin  0.4 mg daily   Michelle JAYSON Barba, MD  [facility] [address], [address] [place], [place] [address] [phone]

## 2023-12-01 NOTE — H&P (View-Only) (Signed)
 12/01/2023 5:15 PM   Michelle Mejia [birth year] [id number]  Referring provider: [name], PA-C [address] [place],  [place] [address]  Chief Complaint  Patient presents with   Cysto    HPI: Michelle Mejia is a 63 y.o. female scheduled today for follow-up of microhematuria.    Refer to my previous note 11/12/2023.  States her voiding symptoms have improved Was scheduled for cystoscopy today however the procedure was unable to be performed secondary to inability to pass the cystoscope per urethra.  PMH: Past Medical History:  Diagnosis Date   Abnormal Pap smear of cervix    Arthritis    hips    BRCA negative 02/04/2012   Sample completed by [name]. Performing laboratory: [facility]. 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   BREAST BIOPSY Right 11/10/2016   A. sclerosing adenosis, dicordant per radiologist x marker   BREAST BIOPSY Right 12/08/2018   MRI bx, dumbell marker, CSL   BREAST CYST ASPIRATION Left 1994   BREAST LUMPECTOMY Right 02/24/2019   CSL   BREAST LUMPECTOMY WITH NEEDLE LOCALIZATION Right 02/24/2019   Procedure: BREAST LUMPECTOMY WITH NEEDLE LOCALIZATION;  Surgeon: [name],  MD;  Location: [facility] ORS;  Service: General;  Laterality: Right;   COLONOSCOPY N/A 04/02/2015   Procedure: COLONOSCOPY;  Surgeon: [name], MD;  Location: [facility] ENDOSCOPY;  Service: Endoscopy;  Laterality: N/A;   CYSTOSCOPY N/A 05/06/2018   Procedure: CYSTOSCOPY;  Surgeon: [name], MD;  Location: [facility] ORS;  Service: Gynecology;  Laterality: N/A;   TONSILLECTOMY     TOTAL ABDOMINAL HYSTERECTOMY W/ BILATERAL SALPINGOOPHORECTOMY  04/2018   Dr. Arloa; leio/FH ovar cancer/BRIP1 mutation    Home Medications:  Allergies as of 12/01/2023       Reactions   Doxycycline Nausea And Vomiting, Other (See Comments)   dizzy   Phenergan [promethazine Hcl] Nausea And Vomiting   Tramadol Nausea And Vomiting, Other (See Comments)   dizzy   Keflex [cephalexin] Rash   Ketek [telithromycin] Itching, Rash   Levaquin [levofloxacin In D5w] Rash   Levofloxacin Rash   Nitrofurantoin Rash   Nitrofurantoin Monohyd Macro Rash   Other Rash   Muscle relaxer medication but not flexeril or robaxin pt unsure of the name.    Oxycodone  Rash   Parafon Forte Dsc [chlorzoxazone] Rash        Medication List        Accurate as of December 01, 2023  5:15 PM. If you have any questions, ask your nurse or doctor.          calcium carbonate 500 MG chewable tablet Commonly known as: TUMS -  dosed in mg elemental calcium Chew 1 tablet by mouth daily as needed for indigestion or heartburn.   estradiol  0.1 MG/GM vaginal cream Commonly known as: ESTRACE  VAGINAL Apply a pea small amount three times weekly   ibuprofen 200 MG tablet Commonly known as: ADVIL Take 600 mg by mouth every 6 (six) hours as needed for headache or moderate pain.   meloxicam 15 MG tablet Commonly known as: MOBIC Take 15 mg by mouth daily.   ONE-A-DAY WOMENS PO Take 1 tablet by mouth daily.   tamsulosin  0.4 MG Caps capsule Commonly known as: FLOMAX  Take 1 capsule (0.4 mg total) by mouth daily. Started by: Glendia JAYSON Barba        Allergies:  Allergies  Allergen Reactions   Doxycycline Nausea And Vomiting and Other (See Comments)    dizzy   Phenergan [Promethazine Hcl] Nausea And Vomiting   Tramadol Nausea And Vomiting and Other (See Comments)    dizzy   Keflex [Cephalexin] Rash   Ketek [Telithromycin] Itching and Rash   Levaquin [Levofloxacin In D5w] Rash   Levofloxacin Rash   Nitrofurantoin Rash   Nitrofurantoin Monohyd Macro Rash   Other Rash    Muscle relaxer medication but not flexeril or robaxin pt unsure of the name.    Oxycodone  Rash   Parafon Forte Dsc [Chlorzoxazone] Rash    Family History: Family History  Problem Relation Age of Onset   Breast cancer Mother 86   Bone cancer Mother    Diabetes Mother    Heart disease Father    Thyroid disease Father    Colon polyps Sister    Cancer Sister        inside appendix   Breast cancer Maternal Aunt        post menopausal    Breast cancer Maternal Aunt        post menopausal    Ovarian cancer Maternal Aunt        post menopausal     Social History:  reports that she has never smoked. She has been exposed to tobacco smoke. She has never used smokeless tobacco. She reports that she does not drink alcohol and does not use drugs.   Physical Exam: BP 109/73   Pulse 72   Ht 5' 4 (1.626 m)   Wt 155 lb (70.3 kg)   LMP  (LMP Unknown) Comment: pt negative for BRCA Gene for breast ca but positive ovarian ca. Complete hysterectomy done 04/2018  SpO2 96%   BMI 26.61 kg/m   Constitutional:  Alert, No acute distress. HEENT:  AT Respiratory: Normal respiratory effort, no increased work of breathing. GU: Urethral meatus was normal in appearance however cystoscope was unable to be placed per urethra Psychiatric: Normal mood and affect.  Laboratory Data:  Urinalysis Dipstick/microscopy negative   Assessment & Plan:    1. Microhematuria Unable to place cystoscope per urethra.  Discussed options of urethral dilation today  followed by cystoscopy and performing the procedure under sedation.  She has elected to perform the procedure under sedation and will schedule in same-day surgery Trial of tamsulosin  0.4 mg daily   Glendia JAYSON Barba, MD  [facility] [address], [address] [place], [place] [address] [phone]

## 2023-12-02 ENCOUNTER — Other Ambulatory Visit: Payer: Self-pay

## 2023-12-02 DIAGNOSIS — R3129 Other microscopic hematuria: Secondary | ICD-10-CM

## 2023-12-02 NOTE — Progress Notes (Unsigned)
 Surgical Physician Order Form  [facility] [place]  Dr. Glendia Barba, MD  * Scheduling expectation : Next Available  *Length of Case: 30 minutes  *Clearance needed: no  *Anticoagulation Instructions: N/A  *Aspirin Instructions: N/A  *Post-op visit Date/Instructions: TBD  *Diagnosis: Microhematuria  *Procedure: Cystoscopy with urethral dilation, possible bladder biopsy/TURBT   Additional orders: N/A  -Admit type: OUTpatient  -Anesthesia: MAC  -VTE Prophylaxis Standing Order SCD's       Other:   -Standing Lab Orders Per Anesthesia    Lab other: UA&Urine Culture  -Standing Test orders EKG/Chest x-ray per Anesthesia       Test other:   - Medications:  Ancef 2gm IV  -Other orders:  N/A

## 2023-12-04 ENCOUNTER — Telehealth: Payer: Self-pay

## 2023-12-04 NOTE — Progress Notes (Signed)
   [facility]- Surgical Posting Form  Surgery Date: Date: 12/31/2023  Surgeon: Dr. 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

## 2023-12-04 NOTE — Telephone Encounter (Signed)
 Per Dr. 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   Patient was instructed that Dr. Twylla will require them to provide a pre-op UA & CX prior to surgery. This was ordered and scheduled drop off appointment was made for 12/31/2023.    Patient was directed to call [phone] between 1-3pm the day before surgery to find out surgical arrival time.  Instructions were given not to eat or drink from midnight on the night before surgery and have a driver for the day of surgery. On the surgery day patient was instructed to enter through the Medical Mall entrance of [facility] report the Same Day Surgery desk.   Pre-Admit Testing will be in contact via phone to set up an interview with the anesthesia team to review your history and medications prior to surgery.   Reminder of this information was sent via MyChart to the patient.

## 2023-12-17 ENCOUNTER — Other Ambulatory Visit

## 2023-12-17 DIAGNOSIS — R3129 Other microscopic hematuria: Secondary | ICD-10-CM

## 2023-12-17 LAB — URINALYSIS, COMPLETE
Bilirubin, UA: NEGATIVE
Glucose, UA: NEGATIVE
Ketones, UA: NEGATIVE
Leukocytes,UA: NEGATIVE
Nitrite, UA: NEGATIVE
Protein,UA: NEGATIVE
Specific Gravity, UA: 1.025 (ref 1.005–1.030)
Urobilinogen, Ur: 0.2 mg/dL (ref 0.2–1.0)
pH, UA: 6 (ref 5.0–7.5)

## 2023-12-17 LAB — MICROSCOPIC EXAMINATION: Epithelial Cells (non renal): >10 /HPF — AB (ref 0–10)

## 2023-12-21 ENCOUNTER — Encounter

## 2023-12-23 ENCOUNTER — Other Ambulatory Visit: Payer: Self-pay

## 2023-12-23 ENCOUNTER — Ambulatory Visit
Admission: RE | Admit: 2023-12-23 | Discharge: 2023-12-23 | Disposition: A | Discharge status: Home / Self Care | Source: Ambulatory Visit | Attending: Surgery | Admitting: Surgery

## 2023-12-23 ENCOUNTER — Inpatient Hospital Stay
Admission: RE | Admit: 2023-12-23 | Discharge: 2023-12-23 | Disposition: A | Discharge status: Home / Self Care | Source: Ambulatory Visit | Attending: Urology | Admitting: Urology

## 2023-12-23 DIAGNOSIS — Z1231 Encounter for screening mammogram for malignant neoplasm of breast: Secondary | ICD-10-CM | POA: Diagnosis present

## 2023-12-23 HISTORY — DX: Prediabetes: R73.03

## 2023-12-23 NOTE — Patient Instructions (Addendum)
 Your procedure is scheduled on: 12/31/23 - Thursday Report to the Registration Desk on the 1st floor of the Medical Mall. To find out your arrival time, please call [phone] between 1PM - 3PM on: 12/30/23 - Wednesday If your arrival time is 6:00 am, do not arrive before that time as the Medical Mall entrance doors do not open until 6:00 am.  REMEMBER: Instructions that are not followed completely may result in serious medical risk, up to and including death; or upon the discretion of your surgeon and anesthesiologist your surgery may need to be rescheduled.  Do not eat food or drink any liquids after midnight the night before surgery.  No gum chewing or hard candies.  One week prior to surgery: Stop Anti-inflammatories (NSAIDS) such as meloxicam (MOBIC) ,Advil, Aleve, Ibuprofen, Motrin, Naproxen, Naprosyn and Aspirin based products such as Excedrin, Goody's Powder, BC Powder. You may take Tylenol  if needed for pain up until the day of surgery.  Stop ANY OVER THE COUNTER supplements until after surgery : CALCIUM+D3,  VITAMIN D -3    ON THE DAY OF SURGERY ONLY TAKE THESE MEDICATIONS WITH SIPS OF WATER: none   No Alcohol for 24 hours before or after surgery.  No Smoking including e-cigarettes for 24 hours before surgery.  No chewable tobacco products for at least 6 hours before surgery.  No nicotine patches on the day of surgery.  Do not use any recreational drugs for at least a week (preferably 2 weeks) before your surgery.  Please be advised that the combination of cocaine and anesthesia may have negative outcomes, up to and including death. If you test positive for cocaine, your surgery will be cancelled.  On the morning of surgery brush your teeth with toothpaste and water, you may rinse your mouth with mouthwash if you wish. Do not swallow any toothpaste or mouthwash.  Take a shower on the morning of surgery.  Do not wear lotions, powders, or perfumes.   Do not shave  body hair from the neck down 48 hours before surgery.  Do not wear jewelry, make-up, hairpins, clips or nail polish.  For welded (permanent) jewelry: bracelets, anklets, waist bands, etc.  Please have this removed prior to surgery.  If it is not removed, there is a chance that hospital personnel will need to cut it off on the day of surgery.  Contact lenses, hearing aids and dentures may not be worn into surgery.  Do not bring valuables to the hospital. [facility] is not responsible for any missing/lost belongings or valuables.   Notify your doctor if there is any change in your medical condition (cold, fever, infection).  Wear comfortable clothing (specific to your surgery type) to the hospital.  After surgery, you can help prevent lung complications by doing breathing exercises.  Take deep breaths and cough every 1-2 hours. Your doctor may order a device called an Incentive Spirometer to help you take deep breaths.  When coughing or sneezing, hold a pillow firmly against your incision with both hands. This is called "splinting." Doing this helps protect your incision. It also decreases belly discomfort.  If you are being admitted to the hospital overnight, leave your suitcase in the car. After surgery it may be brought to your room.  In case of increased patient census, it may be necessary for you, the patient, to continue your postoperative care in the Same Day Surgery department.  If you are being discharged the day of surgery, you will not be allowed to  drive home. You will need a responsible individual to drive you home and stay with you for 24 hours after surgery.   If you are taking public transportation, you will need to have a responsible individual with you.  Please call the Pre-admissions Testing Dept. at [phone] if you have any questions about these instructions.  Surgery Visitation Policy:  Patients having surgery or a procedure may have two visitors.  Children  under the age of 38 must have an adult with them who is not the patient.  Inpatient Visitation:    Visiting hours are 7 a.m. to 8 p.m. Up to four visitors are allowed at one time in a patient room. The visitors may rotate out with other people during the day.  One visitor age 7 or older may stay with the patient overnight and must be in the room by 8 p.m.   Merchandiser, retail to address health-related social needs:  [email or web address]

## 2023-12-25 ENCOUNTER — Ambulatory Visit: Payer: Self-pay | Admitting: Urology

## 2023-12-25 LAB — CULTURE, URINE COMPREHENSIVE

## 2023-12-28 MED ORDER — SULFAMETHOXAZOLE-TRIMETHOPRIM 800-160 MG PO TABS
1.0000 | ORAL_TABLET | Freq: Two times a day (BID) | ORAL | 0 refills | Status: DC
Start: 2023-12-28 — End: 2024-02-17

## 2023-12-28 NOTE — Telephone Encounter (Signed)
-----   Message from Glendia BROCKS [facility] sent at 12/25/2023  8:36 PM EDT ----- Preop urine culture grew a low level of bacteria.  Please send Rx Septra  DS 1 twice daily x 3 days starting Monday or Tuesday ----- Message ----- From: Interface, [facility] Lab Results In Sent: 12/17/2023   9:36 AM EDT To: Glendia BROCKS Barba, MD

## 2023-12-28 NOTE — Telephone Encounter (Signed)
 Spoke with pt. Pt. Advised of results and need to start medication. Medication has been sent over to [facility] on [address] per patient request. Pt. Verbalized understanding.

## 2023-12-30 ENCOUNTER — Ambulatory Visit: Admitting: Surgery

## 2023-12-30 ENCOUNTER — Encounter: Payer: Self-pay | Admitting: Surgery

## 2023-12-30 VITALS — BP 105/65 | HR 88 | Ht 63.0 in | Wt 165.0 lb

## 2023-12-30 DIAGNOSIS — Z09 Encounter for follow-up examination after completed treatment for conditions other than malignant neoplasm: Secondary | ICD-10-CM

## 2023-12-30 DIAGNOSIS — R921 Mammographic calcification found on diagnostic imaging of breast: Secondary | ICD-10-CM | POA: Diagnosis not present

## 2023-12-30 DIAGNOSIS — L299 Pruritus, unspecified: Secondary | ICD-10-CM | POA: Diagnosis not present

## 2023-12-30 NOTE — Patient Instructions (Addendum)
 Patient will be asked to return to the office in one year with a bilateral screening mammogram. We will send you a letter about these appointments.    Continue self breast exams. Call office for any new breast issues or concerns.    How to Do a Breast Self-Exam Doing breast self-exams can help you stay healthy. They're one way to know what's normal for your breasts. They can help you catch a problem while it's still small and can be treated. You need to: Check your breasts often. Tell your doctor about any changes. You should do breast self-exams even if you have breast implants. What you need: A mirror. A well-lit room. A pillow or other soft object. How to do a breast self-exam Look for changes  Take off all the clothes above your waist. Stand in front of a mirror in a room with good lighting. Put your hands down at your sides. Compare your breasts in the mirror. Look for difference between them, such as: Differences in shape. Differences in size. Wrinkles, dips, and bumps in one breast and not the other. Look at each breast for skin changes, such as: Redness. Scaly spots. Spots where your skin is thicker. Dimpling. Open sores. Look for changes in your nipples, such as: Fluid coming out of a nipple. Fluid around a nipple. Bleeding. Dimpling. Redness. A nipple that looks pushed in or that has changed position. Feel for changes Lie on your back. Feel each breast. To do this: Pick a breast to feel. Place a pillow under the shoulder closest to that breast. Put the arm closest to that breast behind your head. Feel the breast using the hand of your other arm. Use the pads of your three middle fingers to make small circles starting near the nipple. Use light, medium, and firm pressure. Keep making circles, moving down over the breast. Stop when you feel your ribs. Start making circles with your fingers again, this time going up until you reach your collarbone. Then, make  circles out across your breast and into your armpit area. Squeeze your nipple. Check for fluid and lumps. Do these steps again to check your other breast. Sit or stand in the tub or shower. With soapy water on your skin, feel each breast the same way you did when you were lying down. Write down what you find Writing down what you find can help you keep track of what you want to tell your doctor. Write down: What's normal for each breast. Any changes you find. Write down: The kind of change. If your breast feels tender or painful. Any lump you find. Write down its size and where it is. When you last had your period. General tips If you're breastfeeding, the best time to check your breasts is after you feed your baby or after you use a breast pump. If you get a period, the best time to check your breasts is 5-7 days after your period ends. With time, you'll get more used to doing the self-exam. You'll also start to know if there are changes in your breasts. Contact a doctor if: You see a change in the shape or size of your breasts or nipples. You see a change in the skin of your breast or nipples. You have fluid coming from your nipples that isn't normal. You find a new lump or thick area. You have breast pain. You have any concerns about your breast health. This information is not intended to replace advice given to  you by your health care provider. Make sure you discuss any questions you have with your health care provider. Document Revised: 06/10/2023 Document Reviewed: 06/10/2023 Elsevier Patient Education  2025 [employer].

## 2023-12-30 NOTE — Progress Notes (Unsigned)
 Outpatient Surgical Follow Up  12/30/2023  Michelle Mejia is an 63 y.o. female.   Chief Complaint  Patient presents with  . Follow-up    HPI: ***  Past Medical History:  Diagnosis Date  . Abnormal Pap smear of cervix   . Arthritis    hips   . BRCA negative 02/04/2012   Sample completed by [facility]. Performing laboratory: [facility]. Assession #:[email or web address] LD  . Cystitis   . Family history of adverse reaction to anesthesia    pts sisters bp drops with epi- pt collapsed after colonoscopy  . Family history of breast cancer   . GERD (gastroesophageal reflux disease)    occ-tums prn  . Headache    h/o migraines  . History of kidney stones    h/o  . Increased risk of breast cancer 2013, 2019   riskscore=28.6% due to FH (not BRIP1 mutation)  . 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 Pre-diabetes   . UTI (urinary tract infection)     Past Surgical History:  Procedure Laterality Date  . APPENDECTOMY    . BREAST BIOPSY Right 07/06/2001   Stereotactic biopsy showing fibrocystic changes, apocrine metaplasia and ductal adenosis, ductal epithelial hyperplasia without atypia.  SABRA BREAST BIOPSY Right 11/10/2016   A. sclerosing adenosis, dicordant per radiologist x marker  . BREAST BIOPSY Right 12/08/2018   MRI bx, dumbell marker, CSL  . BREAST CYST ASPIRATION Left 1994  . BREAST LUMPECTOMY Right 02/24/2019   CSL  . BREAST LUMPECTOMY WITH NEEDLE LOCALIZATION Right 02/24/2019   Procedure: BREAST LUMPECTOMY WITH NEEDLE LOCALIZATION;  Surgeon: [name], MD;  Location: [facility] ORS;  Service: General;  Laterality: Right;  . COLONOSCOPY N/A 04/02/2015   Procedure: COLONOSCOPY;  Surgeon: [name], MD;  Location: [facility] ENDOSCOPY;  Service: Endoscopy;  Laterality: N/A;  . CYSTOSCOPY N/A 05/06/2018   Procedure: CYSTOSCOPY;  Surgeon: [name], MD;  Location: [facility] ORS;  Service: Gynecology;  Laterality: N/A;  .  TONSILLECTOMY    . TOTAL ABDOMINAL HYSTERECTOMY W/ BILATERAL SALPINGOOPHORECTOMY  04/2018   Dr. Arloa; leio/FH ovar cancer/BRIP1 mutation    Family History  Problem Relation Age of Onset  . Breast cancer Mother 64  . Bone cancer Mother   . Diabetes Mother   . Heart disease Father   . Thyroid disease Father   . Colon polyps Sister   . Cancer Sister        inside appendix  . Breast cancer Maternal Aunt        post menopausal   . Breast cancer Maternal Aunt        post menopausal   . Ovarian cancer Maternal Aunt        post menopausal     Social History:  reports that she has never smoked. She has been exposed to tobacco smoke. She has never used smokeless tobacco. She reports that she does not drink alcohol and does not use drugs.  Allergies:  Allergies  Allergen Reactions  . Doxycycline Nausea And Vomiting and Other (See Comments)    dizzy  . Phenergan [Promethazine Hcl] Nausea And Vomiting  . Tramadol Nausea And Vomiting and Other (See Comments)    dizzy  . Keflex [Cephalexin] Rash  . Ketek [Telithromycin] Itching and Rash  . Levaquin [Levofloxacin In D5w] Rash  . Levofloxacin Rash  . Nitrofurantoin Rash  . Nitrofurantoin Monohyd Macro Rash  . Other Rash  Muscle relaxer medication but not flexeril or robaxin pt unsure of the name.   . Oxycodone  Rash  . Parafon Forte Dsc [Chlorzoxazone] Rash    Medications reviewed.    ROS Full ROS performed and is otherwise negative other than what is stated in HPI   BP 105/65   Pulse 88   Ht 5' 3 (1.6 m)   Wt 165 lb (74.8 kg)   LMP  (LMP Unknown) Comment: pt negative for BRCA Gene for breast ca but positive ovarian ca. Complete hysterectomy done 04/2018  SpO2 98%   BMI 29.23 kg/m   Physical Exam    Assessment/Plan: I personally spent a total of *** minutes in the care of the patient today including performing a medically appropriate exam/evaluation, counseling and educating, placing orders, referring and  communicating with other health care professionals, documenting clinical information in the EHR, independently interpreting and reviewing images studies and coordinating care.   Laneta Luna, MD [facility] General Surgeon

## 2023-12-31 ENCOUNTER — Other Ambulatory Visit: Payer: Self-pay

## 2023-12-31 ENCOUNTER — Encounter: Payer: Self-pay | Admitting: Surgery

## 2023-12-31 ENCOUNTER — Ambulatory Visit: Payer: Self-pay

## 2023-12-31 ENCOUNTER — Ambulatory Visit
Admission: RE | Admit: 2023-12-31 | Discharge: 2023-12-31 | Disposition: A | Discharge status: Home / Self Care | Attending: Urology | Admitting: Urology

## 2023-12-31 ENCOUNTER — Encounter: Payer: Self-pay | Admitting: Urology

## 2023-12-31 ENCOUNTER — Encounter
Admission: RE | Disposition: A | Discharge status: Home / Self Care | Payer: Self-pay | Source: Home / Self Care | Attending: Urology

## 2023-12-31 DIAGNOSIS — K219 Gastro-esophageal reflux disease without esophagitis: Secondary | ICD-10-CM | POA: Insufficient documentation

## 2023-12-31 DIAGNOSIS — R3129 Other microscopic hematuria: Secondary | ICD-10-CM

## 2023-12-31 DIAGNOSIS — N3592 Unspecified urethral stricture, female: Secondary | ICD-10-CM | POA: Diagnosis not present

## 2023-12-31 HISTORY — PX: CYSTOSCOPY WITH URETHRAL DILATATION: SHX5125

## 2023-12-31 SURGERY — CYSTOSCOPY, WITH URETHRAL DILATION
Anesthesia: General | Site: Urethra

## 2023-12-31 MED ORDER — FENTANYL CITRATE (PF) 100 MCG/2ML IJ SOLN: 25.0000 ug | INTRAMUSCULAR | Status: DC | PRN

## 2023-12-31 MED ORDER — MIDAZOLAM HCL 2 MG/2ML IJ SOLN
INTRAMUSCULAR | Status: DC | PRN
  Administered 2023-12-31: 2 mg via INTRAVENOUS

## 2023-12-31 MED ORDER — PROPOFOL 10 MG/ML IV BOLUS
INTRAVENOUS | Status: DC | PRN
  Administered 2023-12-31: 40 mg via INTRAVENOUS
  Administered 2023-12-31: 20 mg via INTRAVENOUS

## 2023-12-31 MED ORDER — CEFAZOLIN SODIUM-DEXTROSE 2-4 GM/100ML-% IV SOLN
INTRAVENOUS | Status: AC
  Filled 2023-12-31: qty 100

## 2023-12-31 MED ORDER — ORAL CARE MOUTH RINSE: 15.0000 mL | Freq: Once | OROMUCOSAL | Status: AC

## 2023-12-31 MED ORDER — MIDAZOLAM HCL 2 MG/2ML IJ SOLN
INTRAMUSCULAR | Status: AC
  Filled 2023-12-31: qty 2

## 2023-12-31 MED ORDER — ONDANSETRON HCL 4 MG/2ML IJ SOLN
INTRAMUSCULAR | Status: DC | PRN
  Administered 2023-12-31: 4 mg via INTRAVENOUS

## 2023-12-31 MED ORDER — CHLORHEXIDINE GLUCONATE 0.12 % MT SOLN
15.0000 mL | Freq: Once | OROMUCOSAL | Status: AC
  Administered 2023-12-31: 15 mL via OROMUCOSAL

## 2023-12-31 MED ORDER — CEFAZOLIN SODIUM-DEXTROSE 2-4 GM/100ML-% IV SOLN
2.0000 g | INTRAVENOUS | Status: AC
  Administered 2023-12-31: 2 g via INTRAVENOUS

## 2023-12-31 MED ORDER — PROPOFOL 500 MG/50ML IV EMUL
INTRAVENOUS | Status: DC | PRN
  Administered 2023-12-31: 85 ug/kg/min via INTRAVENOUS

## 2023-12-31 MED ORDER — PHENYLEPHRINE 80 MCG/ML (10ML) SYRINGE FOR IV PUSH (FOR BLOOD PRESSURE SUPPORT)
PREFILLED_SYRINGE | INTRAVENOUS | Status: DC | PRN
  Administered 2023-12-31: 80 ug via INTRAVENOUS

## 2023-12-31 MED ORDER — CHLORHEXIDINE GLUCONATE 0.12 % MT SOLN
OROMUCOSAL | Status: AC
Start: 2023-12-31 — End: 2023-12-31
  Filled 2023-12-31: qty 15

## 2023-12-31 MED ORDER — FENTANYL CITRATE (PF) 100 MCG/2ML IJ SOLN
INTRAMUSCULAR | Status: AC
  Filled 2023-12-31: qty 2

## 2023-12-31 MED ORDER — PHENAZOPYRIDINE HCL 200 MG PO TABS: 200.0000 mg | ORAL_TABLET | Freq: Three times a day (TID) | ORAL | 0 refills | Status: DC | PRN

## 2023-12-31 MED ORDER — FENTANYL CITRATE (PF) 100 MCG/2ML IJ SOLN
INTRAMUSCULAR | Status: DC | PRN
  Administered 2023-12-31: 25 ug via INTRAVENOUS

## 2023-12-31 MED ORDER — DEXAMETHASONE SODIUM PHOSPHATE 10 MG/ML IJ SOLN
INTRAMUSCULAR | Status: DC | PRN
  Administered 2023-12-31: 10 mg via INTRAVENOUS

## 2023-12-31 MED ORDER — SODIUM CHLORIDE 0.9 % IR SOLN
Status: DC | PRN
  Administered 2023-12-31: 3000 mL

## 2023-12-31 MED ORDER — LACTATED RINGERS IV SOLN: INTRAVENOUS | Status: DC

## 2023-12-31 SURGICAL SUPPLY — 33 items
BAG DRAIN SIEMENS DORNER NS (MISCELLANEOUS) ×2 IMPLANT
BAG URINE DRAIN 2000ML AR STRL (UROLOGICAL SUPPLIES) ×1 IMPLANT
BALLOON OPTILUME DCB 24X5X75 (BALLOONS) IMPLANT
BALLOON OPTILUME DCB 30X3X75 (BALLOONS) IMPLANT
BALLOON OPTILUME DCB 30X5X75 (BALLOONS) IMPLANT
BRUSH SCRUB EZ 1% IODOPHOR (MISCELLANEOUS) ×1 IMPLANT
CATH FOL 2WAY LX 16X5 (CATHETERS) ×1 IMPLANT
CATH FOL 2WAY LX 18X30 (CATHETERS) ×1 IMPLANT
CATH FOLEY 2W COUNCIL 20FR 5CC (CATHETERS) IMPLANT
CATH SET URETHRAL DILATOR (CATHETERS) ×1 IMPLANT
DRAPE UTILITY 15X26 TOWEL STRL (DRAPES) ×2 IMPLANT
DRSG TELFA 3X4 N-ADH STERILE (GAUZE/BANDAGES/DRESSINGS) ×1 IMPLANT
ELECTRODE LOOP 22F BIPOLAR SML (ELECTROSURGICAL) IMPLANT
ELECTRODE REM PT RTRN 9FT ADLT (ELECTROSURGICAL) ×1 IMPLANT
GLOVE BIOGEL PI IND STRL 7.5 (GLOVE) ×2 IMPLANT
GOWN STRL REUS W/ TWL LRG LVL3 (GOWN DISPOSABLE) ×4 IMPLANT
GOWN STRL REUS W/ TWL XL LVL3 (GOWN DISPOSABLE) ×4 IMPLANT
GOWN STRL REUS W/TWL XL LVL4 (GOWN DISPOSABLE) ×2 IMPLANT
GUIDEWIRE STR DUAL SENSOR (WIRE) ×1 IMPLANT
HOLDER FOLEY CATH W/STRAP (MISCELLANEOUS) ×2 IMPLANT
KIT TURNOVER CYSTO (KITS) ×2 IMPLANT
LOOP CUT BIPOLAR 24F LRG (ELECTROSURGICAL) IMPLANT
NDL SAFETY ECLIPSE 18X1.5 (NEEDLE) ×1 IMPLANT
PACK CYSTO AR (MISCELLANEOUS) ×2 IMPLANT
SET CYSTO W/LG BORE CLAMP LF (SET/KITS/TRAYS/PACK) ×2 IMPLANT
SET IRRIG Y TYPE TUR BLADDER L (SET/KITS/TRAYS/PACK) ×1 IMPLANT
SOL .9 NS 3000ML IRR UROMATIC (IV SOLUTION) ×3 IMPLANT
SURGILUBE 2OZ TUBE FLIPTOP (MISCELLANEOUS) ×2 IMPLANT
SYR 30ML LL (SYRINGE) ×2 IMPLANT
SYRINGE TOOMEY IRRIG 70ML (MISCELLANEOUS) ×2 IMPLANT
WATER STERILE IRR 1000ML POUR (IV SOLUTION) ×1 IMPLANT
WATER STERILE IRR 3000ML UROMA (IV SOLUTION) ×1 IMPLANT
WATER STERILE IRR 500ML POUR (IV SOLUTION) ×2 IMPLANT

## 2023-12-31 NOTE — Interval H&P Note (Signed)
 History and Physical Interval Note:  12/31/2023 9:00 AM  Michelle Mejia  has presented today for surgery, with the diagnosis of Microhematuria.  The various methods of treatment have been discussed with the patient and family. After consideration of risks, benefits and other options for treatment, the patient has consented to  Procedure(s) with comments: CYSTOSCOPY, WITH URETHRAL DILATION (N/A) CYSTOSCOPY, WITH BIOPSY (N/A) - Bladder TURBT (TRANSURETHRAL RESECTION OF BLADDER TUMOR) (N/A) as a surgical intervention.  The patient's history has been reviewed, patient examined, no change in status, stable for surgery.  I have reviewed the patient's chart and labs.  Questions were answered to the patient's satisfaction.    CV: RRR Lungs: Clear  [facility] [facility] Zalea Pete

## 2023-12-31 NOTE — Op Note (Signed)
   Preoperative diagnosis:  Microhematuria Urethral stenosis  Postoperative diagnosis:  Same  Procedure: Urethral dilation Cystoscopy  Surgeon: Glendia JAYSON Barba, MD  Anesthesia: MAC  Complications: None  Intraoperative findings:  Urethra tight to a 20 [place] sound Bladder mucosa without erythema, solid or papillary lesions.  UOs normal-appearing bilaterally  EBL: Minimal  Specimens: None  Indication: Michelle Mejia is a 63 y.o. patient with history of chronic pelvic pain and microhematuria.  Office cystoscopy unable to be performed due to inability to place a flexible cystoscope per urethra.  She elected urethral dilation under sedation.  After reviewing the management options for treatment, he elected to proceed with the above surgical procedure(s). We have discussed the potential benefits and risks of the procedure, side effects of the proposed treatment, the likelihood of the patient achieving the goals of the procedure, and any potential problems that might occur during the procedure or recuperation. Informed consent has been obtained.  Description of procedure:  The patient was taken to the operating room and general anesthesia was induced.  The patient was placed in the dorsal lithotomy position, prepped and draped in the usual sterile fashion, and preoperative antibiotics were administered. A preoperative time-out was performed.   The urethra was sounded to 3F and was tight to a 8F sound and subsequently dilated to 25F.  A 4F cystoscope sheath with obturator was lubricated and passed per urethra.  Panendoscopy was then performed with findings described above.  The bladder was emptied and cystoscope was removed.  She was then transported to the PACU in stable condition.  Plan: 1 month postop follow-up will be scheduled   Glendia JAYSON Barba, M.D.

## 2023-12-31 NOTE — Transfer of Care (Signed)
 Immediate Anesthesia Transfer of Care Note  Patient: Michelle Mejia  Procedure(s) Performed: CYSTOSCOPY, WITH URETHRAL DILATION (Urethra)  Patient Location: PACU  Anesthesia Type:MAC  Level of Consciousness: drowsy  Airway & Oxygen Therapy: Patient Spontanous Breathing and Patient connected to face mask oxygen  Post-op Assessment: Report given to RN and Post -op Vital signs reviewed and stable  Post vital signs: Reviewed and stable  Last Vitals:  Vitals Value Taken Time  BP 95/63 12/31/23 09:37  Temp 97.5   Pulse 70 12/31/23 09:40  Resp 12 12/31/23 09:40  SpO2 99 % 12/31/23 09:40  Vitals shown include unfiled device data.  Last Pain:  Vitals:   12/31/23 0817  TempSrc: Temporal  PainSc: 0-No pain         Complications: No notable events documented.

## 2023-12-31 NOTE — Discharge Instructions (Signed)
 Cystoscopy patient instructions  Following a cystoscopy, a catheter (a flexible rubber tube) is sometimes left in place to empty the bladder. This may cause some discomfort or a feeling that you need to urinate. Your doctor determines the period of time that the catheter will be left in place. You may have bloody urine for two to three days (Call your doctor if the amount of bleeding increases or does not subside).  You may pass blood clots in your urine, especially if you had a biopsy. It is not unusual to pass small blood clots and have some bloody urine a couple of weeks after your cystoscopy. Again, call your doctor if the bleeding does not subside. You may have: Dysuria (painful urination) Frequency (urinating often) Urgency (strong desire to urinate)  These symptoms are common especially if medicine is instilled into the bladder or a ureteral stent is placed. Avoiding alcohol and caffeine, such as coffee, tea, and chocolate, may help relieve these symptoms. Drink plenty of water, unless otherwise instructed. Your doctor may also prescribe an antibiotic or other medicine to reduce these symptoms.  Before you go home, you will be given specific instructions for follow-up care. Special Instructions:   You may resume your normal activities in 24 hours.   Do not drive or operate machinery if you are taking narcotic pain medicine.   Be sure to keep all follow-up appointments with your doctor.  A prescription for phenazopyridine  which will help with burning with urination was sent to your pharmacy You will be scheduled for a postop follow-up in approximately 1 month  Call Your Doctor If: The catheter is not draining You have severe pain You are unable to urinate You have a fever over 101 You have severe bleeding

## 2023-12-31 NOTE — Anesthesia Preprocedure Evaluation (Signed)
 Anesthesia Evaluation  Patient identified by MRN, date of birth, ID band Patient awake    Reviewed: Allergy & Precautions, NPO status , Patient's Chart, lab work & pertinent test results  History of Anesthesia Complications (+) PROLONGED EMERGENCE and history of anesthetic complications (Pt states that the pain meds post-op made it hard to wake up)  Airway Mallampati: II       Dental  (+) Caps, Dental Advidsory Given, Teeth Intact, Chipped   Pulmonary neg pulmonary ROS, neg sleep apnea, neg COPD, Not current smoker   Pulmonary exam normal        Cardiovascular (-) hypertension(-) Past MI and (-) CHF negative cardio ROS Normal cardiovascular exam(-) dysrhythmias (-) Valvular Problems/Murmurs     Neuro/Psych neg Seizures negative neurological ROS     GI/Hepatic Neg liver ROS,GERD  ,,  Endo/Other  negative endocrine ROSneg diabetes    Renal/GU negative Renal ROS     Musculoskeletal   Abdominal   Peds  Hematology   Anesthesia Other Findings Past Medical History: No date: Abnormal Pap smear of cervix No date: Arthritis     Comment:  hips  02/04/2012: BRCA negative     Comment:  Sample completed by [facility]. Performing               laboratory: [facility]. Assession #:[email or web address] LD No date: Cystitis No date: Family history of adverse reaction to anesthesia     Comment:  pts sisters bp drops with epi- pt collapsed after               colonoscopy No date: Family history of breast cancer No date: GERD (gastroesophageal reflux disease)     Comment:  occ-tums prn No date: Headache     Comment:  h/o migraines No date: History of kidney stones     Comment:  h/o 2013, 2019: Increased risk of breast cancer     Comment:  riskscore=28.6% due to FH (not BRIP1 mutation) 01/2018: Monoallelic mutation of BRIP1 gene     Comment:  [facility] MyRisk postive; increased risk of ovar cancer No date: Pre-diabetes No date: UTI  (urinary tract infection)   Reproductive/Obstetrics                              Anesthesia Physical Anesthesia Plan  ASA: 2  Anesthesia Plan: General   Post-op Pain Management:    Induction: Intravenous  PONV Risk Score and Plan: 3 and Propofol  infusion and TIVA  Airway Management Planned: Natural Airway and Simple Face Mask  Additional Equipment:   Intra-op Plan:   Post-operative Plan:   Informed Consent: I have reviewed the patients History and Physical, chart, labs and discussed the procedure including the risks, benefits and alternatives for the proposed anesthesia with the patient or authorized representative who has indicated his/her understanding and acceptance.       Plan Discussed with:   Anesthesia Plan Comments:          Anesthesia Quick Evaluation

## 2024-01-01 ENCOUNTER — Encounter: Payer: Self-pay | Admitting: Urology

## 2024-01-13 NOTE — Anesthesia Postprocedure Evaluation (Signed)
 Anesthesia Post Note  Patient: Michelle Mejia  Procedure(s) Performed: CYSTOSCOPY, WITH URETHRAL DILATION (Urethra)  Patient location during evaluation: PACU Anesthesia Type: General Level of consciousness: awake and alert Pain management: pain level controlled Vital Signs Assessment: post-procedure vital signs reviewed and stable Respiratory status: spontaneous breathing, nonlabored ventilation, respiratory function stable and patient connected to nasal cannula oxygen Cardiovascular status: blood pressure returned to baseline and stable Postop Assessment: no apparent nausea or vomiting Anesthetic complications: no   No notable events documented.   Last Vitals:  Vitals:   12/31/23 1015 12/31/23 1036  BP: 102/64 (!) 106/57  Pulse: 67 72  Resp: 13 12  Temp:  36.6 C  SpO2: 100% 100%    Last Pain:  Vitals:   12/31/23 1036  TempSrc: Temporal  PainSc: 0-No pain                 Prentice Murphy

## 2024-02-02 ENCOUNTER — Ambulatory Visit: Admitting: Urology

## 2024-02-17 ENCOUNTER — Encounter: Payer: Self-pay | Admitting: Urology

## 2024-02-17 ENCOUNTER — Ambulatory Visit: Admitting: Urology

## 2024-02-17 VITALS — BP 129/79 | HR 98 | Ht 63.0 in | Wt 165.0 lb

## 2024-02-17 DIAGNOSIS — R3129 Other microscopic hematuria: Secondary | ICD-10-CM

## 2024-02-17 DIAGNOSIS — R3915 Urgency of urination: Secondary | ICD-10-CM

## 2024-02-17 NOTE — Progress Notes (Signed)
 02/17/2024 10:10 AM   Michelle Mejia 02/16/1961 [id number]  Referring provider: Fernande Ophelia JINNY DOUGLAS, MD [address] [facility] [place],  [place] [address]  Chief Complaint  Patient presents with   Other    HPI: Michelle Mejia is a 63 y.o. female who presents for follow-up visit.  Previously seen for microhematuria with negative renal ultrasound.  Office cystoscopy unable to be performed secondary to inability to pass the cystoscope per urethra Subsequently underwent cystoscopy under sedation 12/31/2023.  The urethra was tight with 20 [place] sound and dilated to 46 French With no postop problems.  Has baseline urinary urgency which was not improved with tamsulosin  Had started a pelvic floor program but has not been consistent with at home exercises   PMH: Past Medical History:  Diagnosis Date   Abnormal Pap smear of cervix    Arthritis    hips    BRCA negative 02/04/2012   Sample completed by [facility]. Performing laboratory: [facility]. 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   BREAST BIOPSY Right 11/10/2016   A. sclerosing adenosis, dicordant per radiologist x marker   BREAST BIOPSY  Right 12/08/2018   MRI bx, dumbell marker, CSL   BREAST CYST ASPIRATION Left 1994   BREAST LUMPECTOMY Right 02/24/2019   CSL   BREAST LUMPECTOMY WITH NEEDLE LOCALIZATION Right 02/24/2019   Procedure: BREAST LUMPECTOMY WITH NEEDLE LOCALIZATION;  Surgeon: [name], MD;  Location: [facility] ORS;  Service: General;  Laterality: Right;   COLONOSCOPY N/A 04/02/2015   Procedure: COLONOSCOPY;  Surgeon: [name], MD;  Location: [facility] ENDOSCOPY;  Service: Endoscopy;  Laterality: N/A;   CYSTOSCOPY N/A 05/06/2018   Procedure: CYSTOSCOPY;  Surgeon: [name], MD;  Location: [facility] ORS;  Service: Gynecology;  Laterality: N/A;   CYSTOSCOPY WITH URETHRAL DILATATION N/A 12/31/2023   Procedure: CYSTOSCOPY, WITH URETHRAL DILATION;  Surgeon: Twylla Glendia BROCKS, MD;  Location: [facility] ORS;  Service: Urology;  Laterality: N/A;   TONSILLECTOMY     TOTAL ABDOMINAL HYSTERECTOMY W/ BILATERAL SALPINGOOPHORECTOMY  04/2018   Dr. Arloa; leio/FH ovar cancer/BRIP1 mutation    Home Medications:  Allergies as of 02/17/2024       Reactions   Doxycycline Nausea And Vomiting, Other (See Comments)   dizzy   Phenergan [promethazine Hcl] Nausea And Vomiting   Tramadol Nausea And Vomiting, Other (See Comments)   dizzy   Keflex [cephalexin] Rash   Ketek [telithromycin] Itching, Rash   Levaquin [levofloxacin In D5w] Rash   Levofloxacin Rash   Nitrofurantoin Rash   Nitrofurantoin Monohyd Macro Rash   Other Rash   Muscle relaxer medication but not  flexeril or robaxin pt unsure of the name.    Oxycodone  Rash   Parafon Forte Dsc [chlorzoxazone] Rash        Medication List        Accurate as of February 17, 2024 10:10 AM. If you have any questions, ask your nurse or doctor.          STOP taking these medications    EQ FIBER POWDER PO Stopped by: Glendia JAYSON Barba   FIBER GUMMIES PO Stopped by: Glendia JAYSON Barba   phenazopyridine  200 MG tablet Commonly known as: PYRIDIUM  Stopped by: Glendia JAYSON Barba    sulfamethoxazole -trimethoprim  800-160 MG tablet Commonly known as: BACTRIM  DS Stopped by: Glendia JAYSON Barba   VITAMIN D -3 PO Stopped by: Glendia JAYSON Barba       TAKE these medications    calcium carbonate 500 MG chewable tablet Commonly known as: TUMS - dosed in mg elemental calcium Chew 1 tablet by mouth daily as needed for indigestion or heartburn.   CALCIUM+D3 PO Take 1 tablet by mouth every 14 (fourteen) days.   estradiol  0.1 MG/GM vaginal cream Commonly known as: ESTRACE  VAGINAL Apply a pea small amount three times weekly   ibuprofen 200 MG tablet Commonly known as: ADVIL Take 600-800 mg by mouth every 6 (six) hours as needed (pain.).   meloxicam 15 MG tablet Commonly known as: MOBIC Take 15 mg by mouth daily as needed for pain.        Allergies:  Allergies  Allergen Reactions   Doxycycline Nausea And Vomiting and Other (See Comments)    dizzy   Phenergan [Promethazine Hcl] Nausea And Vomiting   Tramadol Nausea And Vomiting and Other (See Comments)    dizzy   Keflex [Cephalexin] Rash   Ketek [Telithromycin] Itching and Rash   Levaquin [Levofloxacin In D5w] Rash   Levofloxacin Rash   Nitrofurantoin Rash   Nitrofurantoin Monohyd Macro Rash   Other Rash    Muscle relaxer medication but not flexeril or robaxin pt unsure of the name.    Oxycodone  Rash   Parafon Forte Dsc [Chlorzoxazone] Rash    Family History: Family History  Problem Relation Age of Onset   Breast cancer Mother 70   Bone cancer Mother    Diabetes Mother    Heart disease Father    Thyroid disease Father    Colon polyps Sister    Cancer Sister        inside appendix   Breast cancer Maternal Aunt        post menopausal    Breast cancer Maternal Aunt        post menopausal    Ovarian cancer Maternal Aunt        post menopausal     Social History:  reports that she has never smoked. She has been exposed to tobacco smoke. She has never used smokeless tobacco. She reports that she does  not drink alcohol and does not use drugs.   Physical Exam: BP 129/79   Pulse 98   Ht 5' 3 (1.6 m)   Wt 165 lb (74.8 kg)   LMP  (LMP Unknown) Comment: pt negative for BRCA Gene for breast ca but positive ovarian ca. Complete hysterectomy done 04/2018  BMI 29.23 kg/m   Constitutional:  Alert, No acute distress. HEENT: [place] AT Respiratory: Normal respiratory effort, no increased work of breathing. Psychiatric: Normal mood and affect.   Assessment & Plan:    1.  Microhematuria Negative RUS; no bladder mucosal  abnormalities on cystoscopy 1 year follow-up with urinalysis  2.  Urinary urgency Not associated with frequency and intermittent symptoms Discussed restarting pelvic floor program Offered trial of sublingual hyoscyamine to take as needed but she wanted to hold off at this time   Glendia JAYSON Barba, MD  [facility] [address] [place], [place] [address] [phone]

## 2025-02-17 ENCOUNTER — Ambulatory Visit: Admitting: Urology
